# Patient Record
Sex: Female | Born: 1955 | Race: White | Hispanic: No | Marital: Married | Smoking: Former smoker
Health system: Southern US, Community
[De-identification: ages and names within clinical notes are randomized; demographics above are authoritative.]

## PROBLEM LIST (undated history)

## (undated) DIAGNOSIS — M1990 Unspecified osteoarthritis, unspecified site: Secondary | ICD-10-CM

## (undated) DIAGNOSIS — I10 Essential (primary) hypertension: Secondary | ICD-10-CM

## (undated) DIAGNOSIS — K219 Gastro-esophageal reflux disease without esophagitis: Secondary | ICD-10-CM

## (undated) HISTORY — PX: JOINT REPLACEMENT: SHX530

## (undated) HISTORY — PX: REPLACEMENT TOTAL HIP W/  RESURFACING IMPLANTS: SUR1222

## (undated) HISTORY — DX: Unspecified osteoarthritis, unspecified site: M19.90

## (undated) HISTORY — DX: Gastro-esophageal reflux disease without esophagitis: K21.9

---

## 2011-11-10 DIAGNOSIS — F32A Depression, unspecified: Secondary | ICD-10-CM | POA: Insufficient documentation

## 2018-08-21 DIAGNOSIS — Z87898 Personal history of other specified conditions: Secondary | ICD-10-CM | POA: Insufficient documentation

## 2018-10-08 DIAGNOSIS — E669 Obesity, unspecified: Secondary | ICD-10-CM | POA: Insufficient documentation

## 2018-10-08 DIAGNOSIS — E785 Hyperlipidemia, unspecified: Secondary | ICD-10-CM | POA: Insufficient documentation

## 2018-10-08 DIAGNOSIS — I10 Essential (primary) hypertension: Secondary | ICD-10-CM | POA: Insufficient documentation

## 2021-01-04 LAB — HM PAP SMEAR

## 2021-03-11 ENCOUNTER — Ambulatory Visit: Payer: Self-pay

## 2021-03-11 ENCOUNTER — Encounter: Payer: Self-pay | Admitting: Emergency Medicine

## 2021-03-11 ENCOUNTER — Other Ambulatory Visit: Payer: Self-pay

## 2021-03-11 ENCOUNTER — Ambulatory Visit
Admission: EM | Admit: 2021-03-11 | Discharge: 2021-03-11 | Disposition: A | Discharge status: Home / Self Care | Payer: BC Managed Care – PPO | Attending: Family Medicine | Admitting: Family Medicine

## 2021-03-11 ENCOUNTER — Ambulatory Visit (INDEPENDENT_AMBULATORY_CARE_PROVIDER_SITE_OTHER): Payer: BC Managed Care – PPO

## 2021-03-11 DIAGNOSIS — M79632 Pain in left forearm: Secondary | ICD-10-CM | POA: Diagnosis not present

## 2021-03-11 DIAGNOSIS — M25522 Pain in left elbow: Secondary | ICD-10-CM | POA: Diagnosis not present

## 2021-03-11 DIAGNOSIS — M79602 Pain in left arm: Secondary | ICD-10-CM | POA: Diagnosis not present

## 2021-03-11 HISTORY — DX: Essential (primary) hypertension: I10

## 2021-03-11 MED ORDER — MELOXICAM 7.5 MG PO TABS: 7.5000 mg | ORAL_TABLET | Freq: Every day | ORAL | 0 refills | Status: DC

## 2021-03-11 NOTE — Discharge Instructions (Addendum)
Medication as needed.  Xray was negative.  Take care  Dr. Adriana Simas
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
## 2021-03-15 ENCOUNTER — Other Ambulatory Visit: Payer: Self-pay | Admitting: Family Medicine

## 2021-12-09 DIAGNOSIS — H8110 Benign paroxysmal vertigo, unspecified ear: Secondary | ICD-10-CM | POA: Insufficient documentation

## 2022-05-10 ENCOUNTER — Ambulatory Visit: Payer: BC Managed Care – PPO | Admitting: Nurse Practitioner

## 2022-06-12 NOTE — Progress Notes (Unsigned)
   There were no vitals taken for this visit.   Subjective:    Patient ID: Victoria Roach, female    DOB: November 12, 1955, 66 y.o.   MRN: [id number]  HPI: Victoria Roach is a 66 y.o. female  No chief complaint on file.  Patient presents to clinic to establish care with new PCP.  Introduced to Designer, jewellery role and practice setting.  All questions answered.  Discussed provider/patient relationship and expectations.  Patient reports a history of ***. Patient denies a history of: Hypertension, Elevated Cholesterol, Diabetes, Thyroid problems, Depression, Anxiety, Neurological problems, and Abdominal problems.   Active Ambulatory Problems    Diagnosis Date Noted   BPPV (benign paroxysmal positional vertigo) 12/09/2021   Depression, unspecified 11/10/2011   History of prediabetes 08/21/2018   Hyperlipidemia 10/08/2018   Hypertension 10/08/2018   Obesity (BMI 30-39.9) 10/08/2018   Resolved Ambulatory Problems    Diagnosis Date Noted   No Resolved Ambulatory Problems   No Additional Past Medical History   Past Surgical History:  Procedure Laterality Date   REPLACEMENT TOTAL HIP W/  RESURFACING IMPLANTS Bilateral    No family history on file.   Review of Systems  Per HPI unless specifically indicated above     Objective:    There were no vitals taken for this visit.  Wt Readings from Last 3 Encounters:  03/11/21 182 lb (82.6 kg)    Physical Exam  No results found for this or any previous visit.    Assessment & Plan:   Problem List Items Addressed This Visit   None    Follow up plan: No follow-ups on file.

## 2022-06-13 ENCOUNTER — Ambulatory Visit (INDEPENDENT_AMBULATORY_CARE_PROVIDER_SITE_OTHER): Payer: Managed Care, Other (non HMO) | Admitting: Nurse Practitioner

## 2022-06-13 ENCOUNTER — Encounter: Payer: Self-pay | Admitting: Nurse Practitioner

## 2022-06-13 VITALS — BP 196/86 | HR 92 | Temp 97.8°F | Ht 64.96 in | Wt 193.4 lb

## 2022-06-13 DIAGNOSIS — I10 Essential (primary) hypertension: Secondary | ICD-10-CM

## 2022-06-13 DIAGNOSIS — E669 Obesity, unspecified: Secondary | ICD-10-CM

## 2022-06-13 DIAGNOSIS — Z1331 Encounter for screening for depression: Secondary | ICD-10-CM

## 2022-06-13 DIAGNOSIS — Z7689 Persons encountering health services in other specified circumstances: Secondary | ICD-10-CM

## 2022-06-13 DIAGNOSIS — Z23 Encounter for immunization: Secondary | ICD-10-CM | POA: Diagnosis not present

## 2022-06-13 MED ORDER — HYDROCHLOROTHIAZIDE 25 MG PO TABS: 25.0000 mg | ORAL_TABLET | Freq: Every day | ORAL | 1 refills | Status: DC

## 2022-06-13 MED ORDER — METFORMIN HCL 500 MG PO TABS: 500.0000 mg | ORAL_TABLET | Freq: Two times a day (BID) | ORAL | 3 refills | Status: DC

## 2022-06-13 MED ORDER — AMLODIPINE BESYLATE 10 MG PO TABS: 10.0000 mg | ORAL_TABLET | Freq: Every day | ORAL | 1 refills | Status: DC

## 2022-06-13 NOTE — Assessment & Plan Note (Signed)
Chronic. History of prediabetes.  Will try Metformin for weight loss.  Side effects and benefits of medications discussed during visit.  Follow up in 1 month for reevaluation.  Call sooner after concerns arise.

## 2022-06-13 NOTE — Assessment & Plan Note (Signed)
Chronic. Not well controlled.  Will increase Amlodipine to 10mg  daily.  Continue with HCTZ 25mg .  Keep track of blood pressure at home and bring log to next visit.  Follow up in 1 month.  Call sooner if concerns arise.

## 2022-06-19 ENCOUNTER — Encounter: Payer: Self-pay | Admitting: Nurse Practitioner

## 2022-08-02 ENCOUNTER — Other Ambulatory Visit: Payer: Self-pay | Admitting: Nurse Practitioner

## 2022-08-02 NOTE — Telephone Encounter (Signed)
Requested Prescriptions  Pending Prescriptions Disp Refills   metFORMIN (GLUCOPHAGE) 500 MG tablet [Pharmacy Med Name: METFORMIN HCL 500 MG TABLET] 360 tablet 1    Sig: TAKE 1 TABLET BY MOUTH IN THE MORNING AND 1 TABLET IN THE EVENING WITH MEALS AFTER 2 WEEKS INCREASE TO 2 TABS IN THE MORNING AND 2 TABS IN EVENING     Endocrinology:  Diabetes - Biguanides Failed - 08/02/2022  4:49 AM      Failed - Cr in normal range and within 360 days    No results found for: "CREATININE", "LABCREAU", "LABCREA", "POCCRE"       Failed - HBA1C is between 0 and 7.9 and within 180 days    No results found for: "HGBA1C", "LABA1C"       Failed - eGFR in normal range and within 360 days    No results found for: "GFRAA", "GFRNONAA", "GFR", "EGFR"       Failed - B12 Level in normal range and within 720 days    No results found for: "VITAMINB12"       Failed - CBC within normal limits and completed in the last 12 months    No results found for: "WBC", "WBCKUC" No results found for: "RBC", "RBCKUC" No results found for: "HGB", "HGBKUC", "HGBPOCKUC", "HGBOTHER", "TOTHGB", "HGBPLASMA", "LABHEMOF" No results found for: "HCT", "HCTKUC", "SRHCT" No results found for: "MCHC", "MCHCKUC" No results found for: "MCH", "[place]" No results found for: "MCVKUC", "MCV" No results found for: "PLTCOUNTKUC", "LABPLAT", "POCPLA" No results found for: "RDW", "[place]", "POCRDW"       Passed - Valid encounter within last 6 months    Recent Outpatient Visits           1 month ago Primary hypertension   [place] Jon Billings, NP       Future Appointments             In 5 days Jon Billings, NP [facility], [place]

## 2022-08-03 NOTE — Progress Notes (Signed)
BP 134/80 (BP Location: Left Arm, Cuff Size: Large)   Pulse 64   Temp 98.6 F (37 C) (Oral)   Ht 5\' 4"  (1.626 m)   Wt 189 lb 12.8 oz (86.1 kg)   SpO2 98%   BMI 32.58 kg/m    Subjective:    Patient ID: , female    DOB: 1955/12/02, 66 y.o.   MRN: [id number]  HPI: Victoria Roach is a 66 y.o. female presenting on 08/07/2022 for comprehensive medical examination. Current medical complaints include: Weight loss  She currently lives with: Menopausal Symptoms: no  HYPERTENSION / HYPERLIPIDEMIA Satisfied with current treatment? yes Duration of hypertension: years BP monitoring frequency: daily BP range: 120-160/70-80 BP medication side effects: no Past BP meds: amlodipine and HCTZ Duration of hyperlipidemia: years Cholesterol medication side effects: no Cholesterol supplements: none Past cholesterol medications: none Medication compliance: excellent compliance Aspirin: no Recent stressors: no Recurrent headaches: no Visual changes: no Palpitations: no Dyspnea: no Chest pain: no Lower extremity edema: no Dizzy/lightheaded: no   Depression Screen done today and results listed below:     08/07/2022    8:05 AM 06/13/2022    9:31 AM  Depression screen PHQ 2/9  Decreased Interest 1 0  Down, Depressed, Hopeless 0 1  PHQ - 2 Score 1 1  Altered sleeping 2 1  Tired, decreased energy 1 2  Change in appetite 1 1  Feeling bad or failure about yourself  0 0  Trouble concentrating 0 0  Moving slowly or fidgety/restless 0 0  Suicidal thoughts 0 0  PHQ-9 Score 5 5  Difficult doing work/chores Not difficult at all Not difficult at all    The patient does not have a history of falls. I did complete a risk assessment for falls. A plan of care for falls was documented.   Past Medical History:  Past Medical History:  Diagnosis Date   Hypertension     Surgical History:  Past Surgical History:  Procedure Laterality Date   REPLACEMENT TOTAL HIP W/   RESURFACING IMPLANTS Bilateral     Medications:  Current Outpatient Medications on File Prior to Visit  Medication Sig   amLODipine (NORVASC) 10 MG tablet Take 1 tablet (10 mg total) by mouth daily.   hydrochlorothiazide (HYDRODIURIL) 25 MG tablet Take 1 tablet (25 mg total) by mouth daily.   metFORMIN (GLUCOPHAGE) 500 MG tablet Take 1 tablet (500 mg total) by mouth 2 (two) times daily with a meal. Take 1 tab in the morning and 1 in the pm. After two weeks increase to 2 tabs in the morning and two in the evening   No current facility-administered medications on file prior to visit.    Allergies:  Allergies  Allergen Reactions   Codeine Nausea And Vomiting    Social History:  Social History   Socioeconomic History   Marital status: Married    Spouse name: Not on file   Number of children: Not on file   Years of education: Not on file   Highest education level: Not on file  Occupational History   Not on file  Tobacco Use   Smoking status: Former    Packs/day: 1.00    Types: Cigarettes    Quit date: 2008    Years since quitting: 15.8   Smokeless tobacco: Never  Vaping Use   Vaping Use: Never used  Substance and Sexual Activity   Alcohol use: Yes    Alcohol/week: 1.0 standard drink of alcohol  Types: 1 Glasses of wine per week   Drug use: Never   Sexual activity: Not Currently  Other Topics Concern   Not on file  Social History Narrative   Not on file   Social Determinants of Health   Financial Resource Strain: Not on file  Food Insecurity: Not on file  Transportation Needs: Not on file  Physical Activity: Not on file  Stress: Not on file  Social Connections: Not on file  Intimate Partner Violence: Not on file   Social History   Tobacco Use  Smoking Status Former   Packs/day: 1.00   Types: Cigarettes   Quit date: 2008   Years since quitting: 15.8  Smokeless Tobacco Never   Social History   Substance and Sexual Activity  Alcohol Use Yes    Alcohol/week: 1.0 standard drink of alcohol   Types: 1 Glasses of wine per week    Family History:  History reviewed. No pertinent family history.  Past medical history, surgical history, medications, allergies, family history and social history reviewed with patient today and changes made to appropriate areas of the chart.   Review of Systems  Eyes:  Negative for blurred vision and double vision.  Respiratory:  Negative for shortness of breath.   Cardiovascular:  Negative for chest pain, palpitations and leg swelling.  Neurological:  Negative for dizziness and headaches.   All other ROS negative except what is listed above and in the HPI.      Objective:    BP 134/80 (BP Location: Left Arm, Cuff Size: Large)   Pulse 64   Temp 98.6 F (37 C) (Oral)   Ht 5\' 4"  (1.626 m)   Wt 189 lb 12.8 oz (86.1 kg)   SpO2 98%   BMI 32.58 kg/m   Wt Readings from Last 3 Encounters:  08/07/22 189 lb 12.8 oz (86.1 kg)  06/13/22 193 lb 6.4 oz (87.7 kg)  03/11/21 182 lb (82.6 kg)    Physical Exam Vitals and nursing note reviewed.  Constitutional:      General: She is awake. She is not in acute distress.    Appearance: Normal appearance. She is well-developed. She is obese. She is not ill-appearing.  HENT:     Head: Normocephalic and atraumatic.     Right Ear: Hearing, tympanic membrane, ear canal and external ear normal. No drainage.     Left Ear: Hearing, tympanic membrane, ear canal and external ear normal. No drainage.     Nose: Nose normal.     Right Sinus: No maxillary sinus tenderness or frontal sinus tenderness.     Left Sinus: No maxillary sinus tenderness or frontal sinus tenderness.     Mouth/Throat:     Mouth: Mucous membranes are moist.     Pharynx: Oropharynx is clear. Uvula midline. No pharyngeal swelling, oropharyngeal exudate or posterior oropharyngeal erythema.  Eyes:     General: Lids are normal.        Right eye: No discharge.        Left eye: No discharge.      Extraocular Movements: Extraocular movements intact.     Conjunctiva/sclera: Conjunctivae normal.     Pupils: Pupils are equal, round, and reactive to light.     Visual Fields: Right eye visual fields normal and left eye visual fields normal.  Neck:     Thyroid: No thyromegaly.     Vascular: No carotid bruit.     Trachea: Trachea normal.  Cardiovascular:     Rate and  Rhythm: Normal rate and regular rhythm.     Heart sounds: Normal heart sounds. No murmur heard.    No gallop.  Pulmonary:     Effort: Pulmonary effort is normal. No accessory muscle usage or respiratory distress.     Breath sounds: Normal breath sounds.  Chest:  Breasts:    Right: Normal.     Left: Normal.  Abdominal:     General: Bowel sounds are normal.     Palpations: Abdomen is soft. There is no hepatomegaly or splenomegaly.     Tenderness: There is no abdominal tenderness.  Musculoskeletal:        General: Normal range of motion.     Cervical back: Normal range of motion and neck supple.     Right lower leg: No edema.     Left lower leg: No edema.  Lymphadenopathy:     Head:     Right side of head: No submental, submandibular, tonsillar, preauricular or posterior auricular adenopathy.     Left side of head: No submental, submandibular, tonsillar, preauricular or posterior auricular adenopathy.     Cervical: No cervical adenopathy.     Upper Body:     Right upper body: No supraclavicular, axillary or pectoral adenopathy.     Left upper body: No supraclavicular, axillary or pectoral adenopathy.  Skin:    General: Skin is warm and dry.     Capillary Refill: Capillary refill takes less than 2 seconds.     Findings: No rash.  Neurological:     Mental Status: She is alert and oriented to person, place, and time.     Gait: Gait is intact.  Psychiatric:        Attention and Perception: Attention normal.        Mood and Affect: Mood normal.        Speech: Speech normal.        Behavior: Behavior normal.  Behavior is cooperative.        Thought Content: Thought content normal.        Judgment: Judgment normal.     Results for orders placed or performed in visit on 06/19/22  HM PAP SMEAR  Result Value Ref Range   HM Pap smear see result scanned into chart       Assessment & Plan:   Problem List Items Addressed This Visit       Cardiovascular and Mediastinum   Hypertension    Chronic. Not well controlled.  Patient will be more diligent about checking at home in the evenings and bringing log to next visit.  Will adjust medications at that time.  Follow up in 1 month.  Call sooner if concerns arise.         Endocrine   History of prediabetes    Chronic.  Controlled.  Continue with current medication regimen of Metformin.  Labs ordered today.  Return to clinic in 6 months for reevaluation.  Call sooner if concerns arise.        Relevant Orders   HgB A1c     Other   Hyperlipidemia    Chronic.  Controlled.  Continue with current medication regimen.  Labs ordered today.  Return to clinic in 6 months for reevaluation.  Call sooner if concerns arise.        Relevant Orders   Lipid panel   Obesity (BMI 30-39.9)    BMI 32.5. Recommended eating smaller high protein, low fat meals more frequently and exercising 30 mins a day  5 times a week with a goal of 10-15lb weight loss in the next 3 months. Patient voiced their understanding and motivation to adhere to these recommendations.      Other Visit Diagnoses     Annual physical exam    -  Primary   Health maintenance reviewed during visit today.  Labs ordered.  Pneumonia shot given.  Mammogram and Cologuard ordered today.  Flu shot up to date. PAP done.   Relevant Orders   CBC with Differential/Platelet   Comprehensive metabolic panel   Lipid panel   TSH   Urinalysis, Routine w reflex microscopic   MM 3D SCREEN BREAST BILATERAL   HgB A1c   Screening for HIV (human immunodeficiency virus)       Relevant Orders   HIV Antibody  (routine testing w rflx)   Encounter for hepatitis C screening test for low risk patient       Relevant Orders   Hepatitis C Antibody   Post-menopausal       Relevant Orders   DG Bone Density   Screening for colon cancer       Relevant Orders   Cologuard   Encounter for screening mammogram for malignant neoplasm of breast       Relevant Orders   MM 3D SCREEN BREAST BILATERAL   Need for pneumococcal 20-valent conjugate vaccination       Relevant Orders   Pneumococcal conjugate vaccine 20-valent (Prevnar 20) (Completed)        Follow up plan: Return in about 1 month (around 09/06/2022) for BP Check, Weight Managment.   LABORATORY TESTING:  - Pap smear: up to date  IMMUNIZATIONS:   - Tdap: Tetanus vaccination status reviewed: last tetanus booster within 10 years. - Influenza: Up to date - Pneumovax: Up to date - Prevnar: Up to date - COVID: Not applicable - HPV: Not applicable - Shingrix vaccine:  Will get at next visit  SCREENING: -Mammogram: Ordered today  - Colonoscopy: Ordered today  - Bone Density: Ordered today  -Hearing Test: Not applicable  -Spirometry: Not applicable   PATIENT COUNSELING:   Advised to take 1 mg of folate supplement per day if capable of pregnancy.   Sexuality: Discussed sexually transmitted diseases, partner selection, use of condoms, avoidance of unintended pregnancy  and contraceptive alternatives.   Advised to avoid cigarette smoking.  I discussed with the patient that most people either abstain from alcohol or drink within safe limits (<=14/week and <=4 drinks/occasion for males, <=7/weeks and <= 3 drinks/occasion for females) and that the risk for alcohol disorders and other health effects rises proportionally with the number of drinks per week and how often a drinker exceeds daily limits.  Discussed cessation/primary prevention of drug use and availability of treatment for abuse.   Diet: Encouraged to adjust caloric intake to maintain   or achieve ideal body weight, to reduce intake of dietary saturated fat and total fat, to limit sodium intake by avoiding high sodium foods and not adding table salt, and to maintain adequate dietary potassium and calcium preferably from fresh fruits, vegetables, and low-fat dairy products.    stressed the importance of regular exercise  Injury prevention: Discussed safety belts, safety helmets, smoke detector, smoking near bedding or upholstery.   Dental health: Discussed importance of regular tooth brushing, flossing, and dental visits.    NEXT PREVENTATIVE PHYSICAL DUE IN 1 YEAR. Return in about 1 month (around 09/06/2022) for BP Check, Weight Managment.

## 2022-08-07 ENCOUNTER — Encounter: Payer: Self-pay | Admitting: Nurse Practitioner

## 2022-08-07 ENCOUNTER — Ambulatory Visit (INDEPENDENT_AMBULATORY_CARE_PROVIDER_SITE_OTHER): Payer: Managed Care, Other (non HMO) | Admitting: Nurse Practitioner

## 2022-08-07 VITALS — BP 134/80 | HR 64 | Temp 98.6°F | Ht 64.0 in | Wt 189.8 lb

## 2022-08-07 DIAGNOSIS — Z0000 Encounter for general adult medical examination without abnormal findings: Secondary | ICD-10-CM

## 2022-08-07 DIAGNOSIS — E782 Mixed hyperlipidemia: Secondary | ICD-10-CM

## 2022-08-07 DIAGNOSIS — Z1231 Encounter for screening mammogram for malignant neoplasm of breast: Secondary | ICD-10-CM

## 2022-08-07 DIAGNOSIS — Z23 Encounter for immunization: Secondary | ICD-10-CM | POA: Diagnosis not present

## 2022-08-07 DIAGNOSIS — Z114 Encounter for screening for human immunodeficiency virus [HIV]: Secondary | ICD-10-CM

## 2022-08-07 DIAGNOSIS — E669 Obesity, unspecified: Secondary | ICD-10-CM | POA: Diagnosis not present

## 2022-08-07 DIAGNOSIS — Z87898 Personal history of other specified conditions: Secondary | ICD-10-CM | POA: Diagnosis not present

## 2022-08-07 DIAGNOSIS — I10 Essential (primary) hypertension: Secondary | ICD-10-CM

## 2022-08-07 DIAGNOSIS — Z1211 Encounter for screening for malignant neoplasm of colon: Secondary | ICD-10-CM

## 2022-08-07 DIAGNOSIS — Z1159 Encounter for screening for other viral diseases: Secondary | ICD-10-CM

## 2022-08-07 DIAGNOSIS — Z780 Asymptomatic menopausal state: Secondary | ICD-10-CM

## 2022-08-07 LAB — URINALYSIS, ROUTINE W REFLEX MICROSCOPIC
Bilirubin, UA: NEGATIVE
Glucose, UA: NEGATIVE
Ketones, UA: NEGATIVE
Nitrite, UA: NEGATIVE
Protein,UA: NEGATIVE
Specific Gravity, UA: 1.02 (ref 1.005–1.030)
Urobilinogen, Ur: 0.2 mg/dL (ref 0.2–1.0)
pH, UA: 5 (ref 5.0–7.5)

## 2022-08-07 LAB — MICROSCOPIC EXAMINATION: Bacteria, UA: NONE SEEN

## 2022-08-07 NOTE — Assessment & Plan Note (Signed)
Chronic. Not well controlled.  Patient will be more diligent about checking at home in the evenings and bringing log to next visit.  Will adjust medications at that time.  Follow up in 1 month.  Call sooner if concerns arise.

## 2022-08-07 NOTE — Assessment & Plan Note (Signed)
BMI 32.5. Recommended eating smaller high protein, low fat meals more frequently and exercising 30 mins a day 5 times a week with a goal of 10-15lb weight loss in the next 3 months. Patient voiced their understanding and motivation to adhere to these recommendations.

## 2022-08-07 NOTE — Assessment & Plan Note (Signed)
Chronic.  Controlled.  Continue with current medication regimen of Metformin.  Labs ordered today.  Return to clinic in 6 months for reevaluation.  Call sooner if concerns arise.

## 2022-08-07 NOTE — Patient Instructions (Signed)
Please call to schedule your mammogram and/or bone density: [facility]  Address: [address], [place], [place] [address] Phone: [phone]  [facility] [place] [address] [place],  [place]  [address] Phone: [phone]   

## 2022-08-07 NOTE — Assessment & Plan Note (Signed)
Chronic.  Controlled.  Continue with current medication regimen.  Labs ordered today.  Return to clinic in 6 months for reevaluation.  Call sooner if concerns arise.  ? ?

## 2022-08-08 LAB — CBC WITH DIFFERENTIAL/PLATELET
Basophils Absolute: 0.1 10*3/uL (ref 0.0–0.2)
Basos: 1 %
EOS (ABSOLUTE): 0.3 10*3/uL (ref 0.0–0.4)
Eos: 4 %
Hematocrit: 42.7 % (ref 34.0–46.6)
Hemoglobin: 14.2 g/dL (ref 11.1–15.9)
Immature Grans (Abs): 0 10*3/uL (ref 0.0–0.1)
Immature Granulocytes: 0 %
Lymphocytes Absolute: 2 10*3/uL (ref 0.7–3.1)
Lymphs: 35 %
MCH: 29.5 pg (ref 26.6–33.0)
MCHC: 33.3 g/dL (ref 31.5–35.7)
MCV: 89 fL (ref 79–97)
Monocytes Absolute: 0.4 10*3/uL (ref 0.1–0.9)
Monocytes: 7 %
Neutrophils Absolute: 3.1 10*3/uL (ref 1.4–7.0)
Neutrophils: 53 %
Platelets: 199 10*3/uL (ref 150–450)
RBC: 4.81 x10E6/uL (ref 3.77–5.28)
RDW: 13.1 % (ref 11.7–15.4)
WBC: 5.9 10*3/uL (ref 3.4–10.8)

## 2022-08-08 LAB — COMPREHENSIVE METABOLIC PANEL
ALT: 17 IU/L (ref 0–32)
AST: 15 IU/L (ref 0–40)
Albumin/Globulin Ratio: 1.9 (ref 1.2–2.2)
Albumin: 4.6 g/dL (ref 3.9–4.9)
Alkaline Phosphatase: 78 IU/L (ref 44–121)
BUN/Creatinine Ratio: 21 (ref 12–28)
BUN: 14 mg/dL (ref 8–27)
Bilirubin Total: 0.3 mg/dL (ref 0.0–1.2)
CO2: 24 mmol/L (ref 20–29)
Calcium: 9.7 mg/dL (ref 8.7–10.3)
Chloride: 104 mmol/L (ref 96–106)
Creatinine, Ser: 0.68 mg/dL (ref 0.57–1.00)
Globulin, Total: 2.4 g/dL (ref 1.5–4.5)
Glucose: 108 mg/dL — ABNORMAL HIGH (ref 70–99)
Potassium: 3.6 mmol/L (ref 3.5–5.2)
Sodium: 144 mmol/L (ref 134–144)
Total Protein: 7 g/dL (ref 6.0–8.5)
eGFR: 97 mL/min/{1.73_m2} (ref >59)

## 2022-08-08 LAB — HEMOGLOBIN A1C
Est. average glucose Bld gHb Est-mCnc: 126 mg/dL
Hgb A1c MFr Bld: 6 % — ABNORMAL HIGH (ref 4.8–5.6)

## 2022-08-08 LAB — LIPID PANEL
Chol/HDL Ratio: 3 ratio (ref 0.0–4.4)
Cholesterol, Total: 211 mg/dL — ABNORMAL HIGH (ref 100–199)
HDL: 70 mg/dL (ref >39)
LDL Chol Calc (NIH): 119 mg/dL — ABNORMAL HIGH (ref 0–99)
Triglycerides: 125 mg/dL (ref 0–149)
VLDL Cholesterol Cal: 22 mg/dL (ref 5–40)

## 2022-08-08 LAB — TSH: TSH: 1.69 u[IU]/mL (ref 0.450–4.500)

## 2022-08-08 LAB — HEPATITIS C ANTIBODY: Hep C Virus Ab: NONREACTIVE

## 2022-08-08 LAB — HIV ANTIBODY (ROUTINE TESTING W REFLEX): HIV Screen 4th Generation wRfx: NONREACTIVE

## 2022-08-08 NOTE — Progress Notes (Signed)
Hi Victoria Roach. It was nice to see you yesterday.  Your lab work looks good.  Your A1c is well controlled in the prediabetic range at 6.0.  Cholesterol is elevated.  I recommend following a low fat diet and exercise.  No other concerns at this time. Continue with your current medication regimen.  Follow up as discussed.  Please let me know if you have any questions.

## 2022-09-04 NOTE — Progress Notes (Unsigned)
There were no vitals taken for this visit.   Subjective:    Patient ID: Victoria Roach, female    DOB: 01/26/1956, 66 y.o.   MRN: [id number]  HPI: Victoria Roach is a 66 y.o. female  No chief complaint on file.  HYPERTENSION / HYPERLIPIDEMIA Satisfied with current treatment? yes Duration of hypertension: years BP monitoring frequency: daily BP range: 120-160/70-80 BP medication side effects: no Past BP meds: amlodipine and HCTZ Duration of hyperlipidemia: years Cholesterol medication side effects: no Cholesterol supplements: none Past cholesterol medications: none Medication compliance: excellent compliance Aspirin: no Recent stressors: no Recurrent headaches: no Visual changes: no Palpitations: no Dyspnea: no Chest pain: no Lower extremity edema: no Dizzy/lightheaded: no   Relevant past medical, surgical, family and social history reviewed and updated as indicated. Interim medical history since our last visit reviewed. Allergies and medications reviewed and updated.  Review of Systems  Per HPI unless specifically indicated above     Objective:    There were no vitals taken for this visit.  Wt Readings from Last 3 Encounters:  08/07/22 189 lb 12.8 oz (86.1 kg)  06/13/22 193 lb 6.4 oz (87.7 kg)  03/11/21 182 lb (82.6 kg)    Physical Exam  Results for orders placed or performed in visit on 08/07/22  Microscopic Examination   Urine  Result Value Ref Range   WBC, UA 0-5 0 - 5 /hpf   RBC, Urine 0-2 0 - 2 /hpf   Epithelial Cells (non renal) 0-10 0 - 10 /hpf   Bacteria, UA None seen None seen/Few  CBC with Differential/Platelet  Result Value Ref Range   WBC 5.9 3.4 - 10.8 x10E3/uL   RBC 4.81 3.77 - 5.28 x10E6/uL   Hemoglobin 14.2 11.1 - 15.9 g/dL   Hematocrit 42.7 34.0 - 46.6 %   MCV 89 79 - 97 fL   MCH 29.5 26.6 - 33.0 pg   MCHC 33.3 31.5 - 35.7 g/dL   RDW 13.1 11.7 - 15.4 %   Platelets 199 150 - 450 x10E3/uL   Neutrophils 53 Not Estab. %    Lymphs 35 Not Estab. %   Monocytes 7 Not Estab. %   Eos 4 Not Estab. %   Basos 1 Not Estab. %   Neutrophils Absolute 3.1 1.4 - 7.0 x10E3/uL   Lymphocytes Absolute 2.0 0.7 - 3.1 x10E3/uL   Monocytes Absolute 0.4 0.1 - 0.9 x10E3/uL   EOS (ABSOLUTE) 0.3 0.0 - 0.4 x10E3/uL   Basophils Absolute 0.1 0.0 - 0.2 x10E3/uL   Immature Granulocytes 0 Not Estab. %   Immature Grans (Abs) 0.0 0.0 - 0.1 x10E3/uL  Comprehensive metabolic panel  Result Value Ref Range   Glucose 108 (H) 70 - 99 mg/dL   BUN 14 8 - 27 mg/dL   Creatinine, Ser 0.68 0.57 - 1.00 mg/dL   eGFR 97 >59 mL/min/1.73   BUN/Creatinine Ratio 21 12 - 28   Sodium 144 134 - 144 mmol/L   Potassium 3.6 3.5 - 5.2 mmol/L   Chloride 104 96 - 106 mmol/L   CO2 24 20 - 29 mmol/L   Calcium 9.7 8.7 - 10.3 mg/dL   Total Protein 7.0 6.0 - 8.5 g/dL   Albumin 4.6 3.9 - 4.9 g/dL   Globulin, Total 2.4 1.5 - 4.5 g/dL   Albumin/Globulin Ratio 1.9 1.2 - 2.2   Bilirubin Total 0.3 0.0 - 1.2 mg/dL   Alkaline Phosphatase 78 44 - 121 IU/L   AST 15 0 - 40 IU/L  ALT 17 0 - 32 IU/L  Lipid panel  Result Value Ref Range   Cholesterol, Total 211 (H) 100 - 199 mg/dL   Triglycerides 125 0 - 149 mg/dL   HDL 70 >39 mg/dL   VLDL Cholesterol Cal 22 5 - 40 mg/dL   LDL Chol Calc (NIH) 119 (H) 0 - 99 mg/dL   Chol/HDL Ratio 3.0 0.0 - 4.4 ratio  TSH  Result Value Ref Range   TSH 1.690 0.450 - 4.500 uIU/mL  Urinalysis, Routine w reflex microscopic  Result Value Ref Range   Specific Gravity, UA 1.020 1.005 - 1.030   pH, UA 5.0 5.0 - 7.5   Color, UA Yellow Yellow   Appearance Ur Clear Clear   Leukocytes,UA Trace (A) Negative   Protein,UA Negative Negative/Trace   Glucose, UA Negative Negative   Ketones, UA Negative Negative   RBC, UA Trace (A) Negative   Bilirubin, UA Negative Negative   Urobilinogen, Ur 0.2 0.2 - 1.0 mg/dL   Nitrite, UA Negative Negative   Microscopic Examination See below:   HgB A1c  Result Value Ref Range   Hgb A1c MFr Bld 6.0 (H) 4.8  - 5.6 %   Est. average glucose Bld gHb Est-mCnc 126 mg/dL  HIV Antibody (routine testing w rflx)  Result Value Ref Range   HIV Screen 4th Generation wRfx Non Reactive Non Reactive  Hepatitis C Antibody  Result Value Ref Range   Hep C Virus Ab Non Reactive Non Reactive      Assessment & Plan:   Problem List Items Addressed This Visit       Cardiovascular and Mediastinum   Hypertension - Primary     Endocrine   History of prediabetes     Other   Hyperlipidemia   Obesity (BMI 30-39.9)     Follow up plan: No follow-ups on file.

## 2022-09-05 ENCOUNTER — Ambulatory Visit (INDEPENDENT_AMBULATORY_CARE_PROVIDER_SITE_OTHER): Payer: Managed Care, Other (non HMO) | Admitting: Nurse Practitioner

## 2022-09-05 ENCOUNTER — Encounter: Payer: Self-pay | Admitting: Nurse Practitioner

## 2022-09-05 VITALS — BP 136/77 | HR 65 | Temp 98.4°F | Wt 195.7 lb

## 2022-09-05 DIAGNOSIS — I10 Essential (primary) hypertension: Secondary | ICD-10-CM | POA: Diagnosis not present

## 2022-09-05 DIAGNOSIS — E669 Obesity, unspecified: Secondary | ICD-10-CM

## 2022-09-05 DIAGNOSIS — E782 Mixed hyperlipidemia: Secondary | ICD-10-CM

## 2022-09-05 DIAGNOSIS — Z87898 Personal history of other specified conditions: Secondary | ICD-10-CM

## 2022-09-05 DIAGNOSIS — R7303 Prediabetes: Secondary | ICD-10-CM | POA: Diagnosis not present

## 2022-09-05 MED ORDER — ZEPBOUND 2.5 MG/0.5ML ~~LOC~~ SOAJ: 2.5000 mg | SUBCUTANEOUS | 0 refills | Status: DC

## 2022-09-05 NOTE — Assessment & Plan Note (Signed)
Chronic.  Controlled.  Continue with current medication regimen of Amlodipine and HCTZ.  Return to clinic in 6 months for reevaluation.  Call sooner if concerns arise.

## 2022-09-05 NOTE — Assessment & Plan Note (Signed)
Chronic.  Controlled without medication.  Last A1c was 6.0.  Return to clinic in 6 months for reevaluation.  Call sooner if concerns arise.

## 2022-09-05 NOTE — Patient Instructions (Signed)
Please call to schedule your mammogram and/or bone density: ?[facility]  ?Address: [address], , [place] [address] ?Phone: [phone]  ?

## 2022-09-05 NOTE — Assessment & Plan Note (Addendum)
Chronic. Will give Zepbound 2.5mg  weekly.  Side effects and benefits of medication discussed. Discussed proper diet and exercise are needed to be successful with weight loss.  Will titrate up as needed for continued weight loss.  Follow up in 1 month for reevaluation.

## 2022-09-06 ENCOUNTER — Telehealth: Payer: Self-pay

## 2022-09-06 NOTE — Telephone Encounter (Signed)
Called and scheduled Dexa and mammogram for 11/09/22 at 9:00 AM. LVM asking for patient to please return my call.

## 2022-09-06 NOTE — Telephone Encounter (Signed)
-----   Message from Larae Grooms, NP sent at 08/30/2022  2:38 PM EST ----- Can we schedule her mammogram?

## 2022-09-22 ENCOUNTER — Other Ambulatory Visit: Payer: Self-pay | Admitting: Nurse Practitioner

## 2022-09-22 MED ORDER — ZEPBOUND 2.5 MG/0.5ML ~~LOC~~ SOAJ: 2.5000 mg | SUBCUTANEOUS | 0 refills | Status: DC

## 2022-10-10 ENCOUNTER — Encounter: Payer: Self-pay | Admitting: Nurse Practitioner

## 2022-10-10 ENCOUNTER — Telehealth: Payer: Managed Care, Other (non HMO) | Admitting: Nurse Practitioner

## 2022-10-10 DIAGNOSIS — K5900 Constipation, unspecified: Secondary | ICD-10-CM

## 2022-10-10 DIAGNOSIS — E669 Obesity, unspecified: Secondary | ICD-10-CM

## 2022-10-10 MED ORDER — ZEPBOUND 5 MG/0.5ML ~~LOC~~ SOAJ: 5.0000 mg | SUBCUTANEOUS | 0 refills | Status: DC

## 2022-10-10 NOTE — Assessment & Plan Note (Signed)
Chronic. Ongoing.  Patient has lost about 9lbs since starting zepbound.  Doing well and denies side effects of nausea and upset stomach.  Patient is having some constipation.  Recommend increasing fiber intake as well as water. Will increased dose of Zepbound to 5mg  weekly.  Follow up in 1 month.  Call sooner if concerns arise.

## 2022-10-10 NOTE — Progress Notes (Signed)
There were no vitals taken for this visit.   Subjective:    Patient ID: Victoria Roach, female    DOB: Aug 18, 1956, 67 y.o.   MRN: [id number]  HPI: Victoria Roach is a 67 y.o. female  Chief Complaint  Patient presents with   Weight Check    1 month f/up- patient states she last weighed 10/03/22 and she weight 186 lbs   WEIGHT GAIN Was started on Zepbound about a month.  She feels like she is doing well with Mounjaro.  She is losing about 3lbs per week.  Current weight is 186lbs. Duration: years Previous attempts at weight loss: yes Complications of obesity: HTN Peak weight: 195 lb Weight loss goal: 145 lb Weight loss to date: 6 months Requesting obesity pharmacotherapy: yes Current weight loss supplements/medications: none Previous weight loss supplements/meds:  b12 injections Weight watchers, Nutrisystem, fasting, calorie counting    Relevant past medical, surgical, family and social history reviewed and updated as indicated. Interim medical history since our last visit reviewed. Allergies and medications reviewed and updated.  Review of Systems  Constitutional:  Positive for unexpected weight change.  Eyes:  Negative for visual disturbance.  Respiratory:  Negative for cough, chest tightness and shortness of breath.   Cardiovascular:  Negative for chest pain, palpitations and leg swelling.  Neurological:  Negative for dizziness and headaches.    Per HPI unless specifically indicated above     Objective:    There were no vitals taken for this visit.  Wt Readings from Last 3 Encounters:  09/05/22 195 lb 11.2 oz (88.8 kg)  08/07/22 189 lb 12.8 oz (86.1 kg)  06/13/22 193 lb 6.4 oz (87.7 kg)    Physical Exam Vitals and nursing note reviewed.  HENT:     Head: Normocephalic.     Right Ear: Hearing normal.     Left Ear: Hearing normal.     Nose: Nose normal.  Eyes:     Pupils: Pupils are equal, round, and reactive to light.  Pulmonary:     Effort: Pulmonary  effort is normal. No respiratory distress.  Neurological:     Mental Status: She is alert.  Psychiatric:        Mood and Affect: Mood normal.        Behavior: Behavior normal.        Thought Content: Thought content normal.        Judgment: Judgment normal.     Results for orders placed or performed in visit on 08/07/22  Microscopic Examination   Urine  Result Value Ref Range   WBC, UA 0-5 0 - 5 /hpf   RBC, Urine 0-2 0 - 2 /hpf   Epithelial Cells (non renal) 0-10 0 - 10 /hpf   Bacteria, UA None seen None seen/Few  CBC with Differential/Platelet  Result Value Ref Range   WBC 5.9 3.4 - 10.8 x10E3/uL   RBC 4.81 3.77 - 5.28 x10E6/uL   Hemoglobin 14.2 11.1 - 15.9 g/dL   Hematocrit 42.7 34.0 - 46.6 %   MCV 89 79 - 97 fL   MCH 29.5 26.6 - 33.0 pg   MCHC 33.3 31.5 - 35.7 g/dL   RDW 13.1 11.7 - 15.4 %   Platelets 199 150 - 450 x10E3/uL   Neutrophils 53 Not Estab. %   Lymphs 35 Not Estab. %   Monocytes 7 Not Estab. %   Eos 4 Not Estab. %   Basos 1 Not Estab. %   Neutrophils Absolute 3.1 1.4 -  7.0 x10E3/uL   Lymphocytes Absolute 2.0 0.7 - 3.1 x10E3/uL   Monocytes Absolute 0.4 0.1 - 0.9 x10E3/uL   EOS (ABSOLUTE) 0.3 0.0 - 0.4 x10E3/uL   Basophils Absolute 0.1 0.0 - 0.2 x10E3/uL   Immature Granulocytes 0 Not Estab. %   Immature Grans (Abs) 0.0 0.0 - 0.1 x10E3/uL  Comprehensive metabolic panel  Result Value Ref Range   Glucose 108 (H) 70 - 99 mg/dL   BUN 14 8 - 27 mg/dL   Creatinine, Ser 2.95 0.57 - 1.00 mg/dL   eGFR 97 >18 AC/ZYS/0.63   BUN/Creatinine Ratio 21 12 - 28   Sodium 144 134 - 144 mmol/L   Potassium 3.6 3.5 - 5.2 mmol/L   Chloride 104 96 - 106 mmol/L   CO2 24 20 - 29 mmol/L   Calcium 9.7 8.7 - 10.3 mg/dL   Total Protein 7.0 6.0 - 8.5 g/dL   Albumin 4.6 3.9 - 4.9 g/dL   Globulin, Total 2.4 1.5 - 4.5 g/dL   Albumin/Globulin Ratio 1.9 1.2 - 2.2   Bilirubin Total 0.3 0.0 - 1.2 mg/dL   Alkaline Phosphatase 78 44 - 121 IU/L   AST 15 0 - 40 IU/L   ALT 17 0 - 32 IU/L   Lipid panel  Result Value Ref Range   Cholesterol, Total 211 (H) 100 - 199 mg/dL   Triglycerides 016 0 - 149 mg/dL   HDL 70 >01 mg/dL   VLDL Cholesterol Cal 22 5 - 40 mg/dL   LDL Chol Calc (NIH) 093 (H) 0 - 99 mg/dL   Chol/HDL Ratio 3.0 0.0 - 4.4 ratio  TSH  Result Value Ref Range   TSH 1.690 0.450 - 4.500 uIU/mL  Urinalysis, Routine w reflex microscopic  Result Value Ref Range   Specific Gravity, UA 1.020 1.005 - 1.030   pH, UA 5.0 5.0 - 7.5   Color, UA Yellow Yellow   Appearance Ur Clear Clear   Leukocytes,UA Trace (A) Negative   Protein,UA Negative Negative/Trace   Glucose, UA Negative Negative   Ketones, UA Negative Negative   RBC, UA Trace (A) Negative   Bilirubin, UA Negative Negative   Urobilinogen, Ur 0.2 0.2 - 1.0 mg/dL   Nitrite, UA Negative Negative   Microscopic Examination See below:   HgB A1c  Result Value Ref Range   Hgb A1c MFr Bld 6.0 (H) 4.8 - 5.6 %   Est. average glucose Bld gHb Est-mCnc 126 mg/dL  HIV Antibody (routine testing w rflx)  Result Value Ref Range   HIV Screen 4th Generation wRfx Non Reactive Non Reactive  Hepatitis C Antibody  Result Value Ref Range   Hep C Virus Ab Non Reactive Non Reactive      Assessment & Plan:   Problem List Items Addressed This Visit       Other   Obesity (BMI 30-39.9) - Primary    Chronic. Ongoing.  Patient has lost about 9lbs since starting zepbound.  Doing well and denies side effects of nausea and upset stomach.  Patient is having some constipation.  Recommend increasing fiber intake as well as water. Will increased dose of Zepbound to 5mg  weekly.  Follow up in 1 month.  Call sooner if concerns arise.       Relevant Medications   tirzepatide (ZEPBOUND) 5 MG/0.5ML Pen     Follow up plan: Return in about 1 month (around 11/10/2022) for Weight Managment (virtual).  This visit was completed via MyChart due to the restrictions of the COVID-19  pandemic. All issues as above were discussed and addressed.  Physical exam was done as above through visual confirmation on MyChart. If it was felt that the patient should be evaluated in the office, they were directed there. The patient verbally consented to this visit. Location of the patient: Home Location of the provider: Office Those involved with this call:  Provider: Jon Billings, NP CMA: Yvonna Alanis, CMA Front Desk/Registration: Lynnell Catalan This encounter was conducted via video.  I spent 30 dedicated to the care of this patient on the date of this encounter to include previsit review of symptoms, medications, plan of care and follow up, face to face time with the patient, and post visit ordering of testing.

## 2022-10-18 ENCOUNTER — Ambulatory Visit
Admission: RE | Admit: 2022-10-18 | Discharge: 2022-10-18 | Disposition: A | Discharge status: Home / Self Care | Payer: Managed Care, Other (non HMO) | Source: Ambulatory Visit | Attending: Urgent Care | Admitting: Urgent Care

## 2022-10-18 VITALS — BP 128/89 | HR 90 | Temp 99.1°F | Resp 18

## 2022-10-18 DIAGNOSIS — J069 Acute upper respiratory infection, unspecified: Secondary | ICD-10-CM

## 2022-10-18 DIAGNOSIS — J029 Acute pharyngitis, unspecified: Secondary | ICD-10-CM | POA: Diagnosis not present

## 2022-10-18 LAB — POCT RAPID STREP A (OFFICE): Rapid Strep A Screen: NEGATIVE

## 2022-10-18 MED ORDER — LIDOCAINE VISCOUS HCL 2 % MT SOLN: 15.0000 mL | OROMUCOSAL | 0 refills | Status: AC | PRN

## 2022-10-18 NOTE — Discharge Instructions (Signed)
Follow up here or with your primary care provider if your symptoms are worsening or not improving.     

## 2022-10-18 NOTE — ED Provider Notes (Signed)
Victoria Roach    CSN: [id number] Arrival date & time: 10/18/22  1143      History   Chief Complaint Chief Complaint  Patient presents with   Sore Throat    Severe sore throat for three days.  Mild flu symptoms. - Entered by patient   Fatigue   Generalized Body Aches    HPI Victoria Roach is a 67 y.o. female.    Sore Throat    Presents to urgent care with complaint of acutely sore throat "like razor blades", fatigue, body aches x 3 days.  She states she has "mild flu symptoms".  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      Home Medications    Prior to Admission medications   Medication Sig Start Date End Date Taking? Authorizing Provider  amLODipine (NORVASC) 10 MG tablet Take 1 tablet (10 mg total) by mouth daily. 06/13/22   Jon Billings, NP  hydrochlorothiazide (HYDRODIURIL) 25 MG tablet Take 1 tablet (25 mg total) by mouth daily. 06/13/22   Jon Billings, NP  tirzepatide [facility]) 5 MG/0.5ML Pen Inject 5 mg into the skin once a week. 10/10/22   Jon Billings, NP    Family History History reviewed. No pertinent family history.  Social History Social History   Tobacco Use   Smoking status: Former    Packs/day: 1.00    Types: Cigarettes    Quit date: 2008    Years since quitting: 16.0   Smokeless tobacco: Never  Vaping Use   Vaping Use: Never used  Substance Use Topics   Alcohol use: Yes    Alcohol/week: 1.0 standard drink of alcohol    Types: 1 Glasses of wine per week   Drug use: Never     Allergies    Codeine   Review of Systems Review of Systems   Physical Exam Triage Vital Signs ED Triage Vitals  Enc Vitals Group     BP 10/18/22 1155 128/89     Pulse Rate 10/18/22 1155 90     Resp 10/18/22 1155 18     Temp 10/18/22 1155 99.1 F (37.3 C)     Temp Source 10/18/22 1155 Oral     SpO2 10/18/22 1155 94 %     Weight --      Height --      Head Circumference --      Peak Flow --      Pain Score 10/18/22 1159 0     Pain Loc --      Pain Edu? --      Excl. in [place]? --    No data found.  Updated Vital Signs BP 128/89 (BP Location: Left Arm)   Pulse 90   Temp 99.1 F (37.3 C) (Oral)   Resp 18   SpO2 94%   Visual Acuity Right Eye Distance:   Left Eye Distance:   Bilateral Distance:    Right Eye Near:   Left Eye Near:    Bilateral Near:     Physical Exam Vitals reviewed.  Constitutional:  Appearance: She is well-developed.  HENT:     Mouth/Throat:     Mouth: Mucous membranes are moist.     Pharynx: Posterior oropharyngeal erythema present. No oropharyngeal exudate.  Cardiovascular:     Rate and Rhythm: Normal rate and regular rhythm.     Heart sounds: Normal heart sounds.  Pulmonary:     Effort: Pulmonary effort is normal.     Breath sounds: Normal breath sounds.  Skin:    General: Skin is warm and dry.  Neurological:     General: No focal deficit present.     Mental Status: She is alert and oriented to person, place, and time.  Psychiatric:        Mood and Affect: Mood normal.        Behavior: Behavior normal.      UC Treatments / Results  Labs (all labs ordered are listed, but only abnormal results are displayed) Labs Reviewed  POCT RAPID STREP A (OFFICE)    EKG   Radiology No results found.  Procedures Procedures (including critical care time)  Medications Ordered in UC Medications - No data to display  Initial Impression / Assessment and Plan / UC Course  I have reviewed the triage vital signs and the nursing  notes.  Pertinent labs & imaging results that were available during my care of the patient were reviewed by me and considered in my medical decision making (see chart for details).   Patient is afebrile here without recent antipyretics. Satting well on room air. Overall is well appearing, well hydrated, without respiratory distress.  She has pharyngeal erythema, without peritonsillar exudates.  Rapid strep is negative.  Symptoms are consistent with an acute viral process including influenza.  She is outside the window for treatment with Tamiflu and will recommend use of OTC medication for symptom control.  Suggested ibuprofen, Chloraseptic for relief of her sore throat.  Also offered lidocaine and provided a treatment in clinic.   Final Clinical Impressions(s) / UC Diagnoses   Final diagnoses:  None   Discharge Instructions   None    ED Prescriptions   None    PDMP not reviewed this encounter.   Rose Phi, [place] 10/18/22 1223

## 2022-10-18 NOTE — ED Triage Notes (Signed)
Pt. Presents to UC w/ c/o a sore throat, fatigue and body aches for the past 3 days.

## 2022-11-09 ENCOUNTER — Other Ambulatory Visit: Payer: Managed Care, Other (non HMO)

## 2022-11-13 ENCOUNTER — Encounter: Payer: Self-pay | Admitting: Nurse Practitioner

## 2022-11-13 ENCOUNTER — Telehealth (INDEPENDENT_AMBULATORY_CARE_PROVIDER_SITE_OTHER): Payer: Managed Care, Other (non HMO) | Admitting: Nurse Practitioner

## 2022-11-13 VITALS — Wt 177.0 lb

## 2022-11-13 DIAGNOSIS — Z713 Dietary counseling and surveillance: Secondary | ICD-10-CM

## 2022-11-13 DIAGNOSIS — E669 Obesity, unspecified: Secondary | ICD-10-CM | POA: Diagnosis not present

## 2022-11-13 MED ORDER — ZEPBOUND 7.5 MG/0.5ML ~~LOC~~ SOAJ: 7.5000 mg | SUBCUTANEOUS | 0 refills | Status: DC

## 2022-11-13 NOTE — Progress Notes (Signed)
Wt 177 lb (80.3 kg)   BMI 30.38 kg/m    Subjective:    Patient ID: Victoria Roach, female    DOB: 03/15/1956, 67 y.o.   MRN: [id number]  HPI: Victoria Roach is a 67 y.o. female  Chief Complaint  Patient presents with   Medication Management   WEIGHT GAIN Was started on Zepbound about a month.  She feels like she is doing well with Mounjaro.  She doesn't have any side effects.  She has lost about 18lbs.  States she feels really good.   Duration: years Previous attempts at weight loss: yes Complications of obesity: HTN Peak weight: 195 lb Weight loss goal: 145 lb Weight loss to date: 6 months Requesting obesity pharmacotherapy: yes Current weight loss supplements/medications: none Previous weight loss supplements/meds:  b12 injections Weight watchers, Nutrisystem, fasting, calorie counting  Relevant past medical, surgical, family and social history reviewed and updated as indicated. Interim medical history since our last visit reviewed. Allergies and medications reviewed and updated.  Review of Systems  Constitutional:  Negative for unexpected weight change.    Per HPI unless specifically indicated above     Objective:    Wt 177 lb (80.3 kg)   BMI 30.38 kg/m   Wt Readings from Last 3 Encounters:  11/13/22 177 lb (80.3 kg)  09/05/22 195 lb 11.2 oz (88.8 kg)  08/07/22 189 lb 12.8 oz (86.1 kg)    Physical Exam Vitals and nursing note reviewed.  Constitutional:      General: She is not in acute distress.    Appearance: She is not ill-appearing.  HENT:     Head: Normocephalic.     Right Ear: Hearing normal.     Left Ear: Hearing normal.     Nose: Nose normal.  Pulmonary:     Effort: Pulmonary effort is normal. No respiratory distress.  Neurological:     Mental Status: She is alert.  Psychiatric:        Mood and Affect: Mood normal.        Behavior: Behavior normal.        Thought Content: Thought content normal.        Judgment: Judgment normal.      Results for orders placed or performed during the hospital encounter of 10/18/22  POCT rapid strep A  Result Value Ref Range   Rapid Strep A Screen Negative Negative      Assessment & Plan:   Problem List Items Addressed This Visit       Other   Obesity (BMI 30-39.9) - Primary    Chronic. Ongoing.  Patient has lost about 18lbs since starting zepbound.  Doing well and denies side effects of nausea and upset stomach.   Recommend increasing fiber intake as well as water. Will increased dose of Zepbound to 7.68m weekly.  Follow up in 2 months.  Will check CMP at next visit.  Call sooner if concerns arise.       Relevant Medications   tirzepatide (ZEPBOUND) 7.5 MG/0.5ML Pen     Follow up plan: Return in about 2 months (around 01/12/2023) for Weight Managment.  This visit was completed via MyChart due to the restrictions of the COVID-19 pandemic. All issues as above were discussed and addressed. Physical exam was done as above through visual confirmation on MyChart. If it was felt that the patient should be evaluated in the office, they were directed there. The patient verbally consented to this visit. Location of the patient: Home Location  of the provider: Office Those involved with this call:  Provider: Jon Billings, NP CMA: Yvonna Alanis, [place] Desk/Registration: Lynnell Catalan This encounter was conducted via video.  I spent 30 dedicated to the care of this patient on the date of this encounter to include previsit review of symptoms, plan of care and follow up, face to face time with the patient, and post visit ordering of testing.

## 2022-11-13 NOTE — Assessment & Plan Note (Signed)
Chronic. Ongoing.  Patient has lost about 18lbs since starting zepbound.  Doing well and denies side effects of nausea and upset stomach.   Recommend increasing fiber intake as well as water. Will increased dose of Zepbound to 7.99m weekly.  Follow up in 2 months.  Will check CMP at next visit.  Call sooner if concerns arise.

## 2022-11-13 NOTE — Patient Instructions (Signed)
Please call to schedule your mammogram and/or bone density: [facility]  Address: [address], [place], [place] [address] Phone: [phone]  [facility] [place] [address] [place],    [address] Phone: [phone]   

## 2022-11-14 NOTE — Progress Notes (Signed)
Appointment has been scheduled.

## 2022-12-01 ENCOUNTER — Other Ambulatory Visit: Payer: Self-pay | Admitting: Nurse Practitioner

## 2022-12-01 NOTE — Telephone Encounter (Signed)
90 day courtesy RF  Requested Prescriptions  Pending Prescriptions Disp Refills   amLODipine (NORVASC) 10 MG tablet [Pharmacy Med Name: AMLODIPINE BESYLATE 10 MG TAB] 90 tablet 0    Sig: TAKE 1 TABLET BY MOUTH EVERY DAY     Cardiovascular: Calcium Channel Blockers 2 Passed - 12/01/2022  9:14 AM      Passed - Last BP in normal range    BP Readings from Last 1 Encounters:  10/18/22 128/89         Passed - Last Heart Rate in normal range    Pulse Readings from Last 1 Encounters:  10/18/22 90         Passed - Valid encounter within last 6 months    Recent Outpatient Visits           2 weeks ago Obesity (BMI 30-39.9)   [place], NP   1 month ago Obesity (BMI 30-39.9)   [place], NP   2 months ago Primary hypertension   [place], NP   3 months ago Annual physical exam   [place], NP   5 months ago Primary hypertension   [place], NP       Future Appointments             In 1 month Jon Billings, NP [place], [facility]             hydrochlorothiazide (HYDRODIURIL) 25 MG tablet [Pharmacy Med Name: HYDROCHLOROTHIAZIDE 25 MG TAB] 90 tablet 0    Sig: TAKE 1 TABLET (25 MG TOTAL) BY MOUTH DAILY.     Cardiovascular: Diuretics - Thiazide Passed - 12/01/2022  9:14 AM      Passed - Cr in normal range and within 180 days    Creatinine, Ser  Date Value Ref Range Status  08/07/2022 0.68 0.57 - 1.00 mg/dL Final         Passed - K in normal range and within 180 days    Potassium  Date Value Ref Range Status  08/07/2022 3.6 3.5 - 5.2 mmol/L Final         Passed - Na in normal range and within 180 days    Sodium  Date Value Ref Range Status  08/07/2022 144 134 - 144 mmol/L Final         Passed - Last BP in normal range     BP Readings from Last 1 Encounters:  10/18/22 128/89         Passed - Valid encounter within last 6 months    Recent Outpatient Visits           2 weeks ago Obesity (BMI 30-39.9)   [place], NP   1 month ago Obesity (BMI 30-39.9)   [place], NP   2 months ago Primary hypertension   [place], NP   3 months ago Annual physical exam   [place], NP   5 months ago Primary hypertension   [place] Jon Billings, NP       Future Appointments             In 1 month Jon Billings, NP [place], [facility]
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
## 2022-12-10 ENCOUNTER — Encounter: Payer: Self-pay | Admitting: Nurse Practitioner

## 2022-12-12 ENCOUNTER — Other Ambulatory Visit: Payer: Self-pay | Admitting: Nurse Practitioner

## 2022-12-12 MED ORDER — ZEPBOUND 5 MG/0.5ML ~~LOC~~ SOAJ: 5.0000 mg | SUBCUTANEOUS | 1 refills | Status: DC

## 2022-12-13 NOTE — Telephone Encounter (Signed)
Requested Prescriptions  Pending Prescriptions Disp Refills   ZEPBOUND 5 MG/0.5ML Pen [Pharmacy Med Name: ZEPBOUND 5 MG/0.5 ML PEN]  1    Sig: INJECT 5 MG SUBCUTANEOUSLY WEEKLY     Off-Protocol Failed - 12/12/2022  9:04 AM      Failed - Medication not assigned to a protocol, review manually.      Passed - Valid encounter within last 12 months    Recent Outpatient Visits           1 month ago Obesity (BMI 30-39.9)   [place], NP   2 months ago Obesity (BMI 30-39.9)   [place], NP   3 months ago Primary hypertension   [place], Karen, NP   4 months ago Annual physical exam   [place], NP   6 months ago Primary hypertension   [place] Jon Billings, NP       Future Appointments             In 1 month Jon Billings, NP [place], [facility]

## 2023-01-15 ENCOUNTER — Other Ambulatory Visit: Payer: Self-pay | Admitting: Nurse Practitioner

## 2023-01-15 ENCOUNTER — Ambulatory Visit (INDEPENDENT_AMBULATORY_CARE_PROVIDER_SITE_OTHER): Payer: Managed Care, Other (non HMO) | Admitting: Nurse Practitioner

## 2023-01-15 ENCOUNTER — Encounter: Payer: Self-pay | Admitting: Nurse Practitioner

## 2023-01-15 VITALS — BP 138/80 | HR 56 | Temp 98.2°F | Wt 170.2 lb

## 2023-01-15 DIAGNOSIS — I10 Essential (primary) hypertension: Secondary | ICD-10-CM | POA: Diagnosis not present

## 2023-01-15 DIAGNOSIS — E669 Obesity, unspecified: Secondary | ICD-10-CM

## 2023-01-15 DIAGNOSIS — R7303 Prediabetes: Secondary | ICD-10-CM

## 2023-01-15 DIAGNOSIS — E782 Mixed hyperlipidemia: Secondary | ICD-10-CM | POA: Diagnosis not present

## 2023-01-15 MED ORDER — ZEPBOUND 10 MG/0.5ML ~~LOC~~ SOAJ: 10.0000 mg | SUBCUTANEOUS | 2 refills | Status: DC

## 2023-01-15 NOTE — Assessment & Plan Note (Signed)
Chronic.  Controlled.  Continue with current medication regimen of Amlodipine and HCTZ.  Return to clinic in 6 months for reevaluation.  Call sooner if concerns arise.   

## 2023-01-15 NOTE — Assessment & Plan Note (Signed)
Chronic. Improved.  Has lost 25lbs on Zepbound.  Will increase dose to .  Will continue with this dose for 3 months.  Labs ordered today.  Follow up in 3 months.  Call sooner if concerns arise.

## 2023-01-15 NOTE — Assessment & Plan Note (Signed)
Chronic.  Controlled.  Continue with current medication regimen.  Labs ordered today.  Return to clinic in 6 months for reevaluation.  Call sooner if concerns arise.  ? ?

## 2023-01-15 NOTE — Assessment & Plan Note (Signed)
Labs ordered at visit today.  Will make recommendations based on lab results.   

## 2023-01-15 NOTE — Progress Notes (Signed)
BP 138/80   Pulse (!) 56   Temp 98.2 F (36.8 C) (Oral)   Wt 170 lb 3.2 oz (77.2 kg)   SpO2 97%   BMI 29.21 kg/m    Subjective:    Patient ID: Victoria Roach, female    DOB: [birth year], 67 y.o.   MRN: [id number]  HPI: Victoria Roach is a 67 y.o. female  Chief Complaint  Patient presents with   Hypertension    Pt states she forgot to take her medication before today's visit    Obesity   HYPERTENSION / HYPERLIPIDEMIA Satisfied with current treatment? yes Duration of hypertension: years BP monitoring frequency: not checking BP range:  BP medication side effects: no Past BP meds: amlodipine and HCTZ Duration of hyperlipidemia: years Cholesterol medication side effects: no Cholesterol supplements: none Past cholesterol medications: none Medication compliance: excellent compliance Aspirin: no Recent stressors: no Recurrent headaches: no Visual changes: no Palpitations: no Dyspnea: no Chest pain: no Lower extremity edema: no Dizzy/lightheaded: no  WEIGHT MANAGEMENT Patient has lost about 25lbs since started Zepbound.  She is on the 7.5mg  dose.  She feels happy with her progress but would like to continue to lose.  She hasn't been exercising yet.  But plans to start soon to aid in her weight loss.    Relevant past medical, surgical, family and social history reviewed and updated as indicated. Interim medical history since our last visit reviewed. Allergies and medications reviewed and updated.  Review of Systems  Eyes:  Negative for visual disturbance.  Respiratory:  Negative for cough, chest tightness and shortness of breath.   Cardiovascular:  Negative for chest pain, palpitations and leg swelling.  Neurological:  Negative for dizziness and headaches.    Per HPI unless specifically indicated above     Objective:    BP 138/80   Pulse (!) 56   Temp 98.2 F (36.8 C) (Oral)   Wt 170 lb 3.2 oz (77.2 kg)   SpO2 97%   BMI 29.21 kg/m   Wt Readings from  Last 3 Encounters:  01/15/23 170 lb 3.2 oz (77.2 kg)  11/13/22 177 lb (80.3 kg)  09/05/22 195 lb 11.2 oz (88.8 kg)    Physical Exam Vitals and nursing note reviewed.  Constitutional:      General: She is not in acute distress.    Appearance: Normal appearance. She is normal weight. She is not ill-appearing, toxic-appearing or diaphoretic.  HENT:     Head: Normocephalic.     Right Ear: External ear normal.     Left Ear: External ear normal.     Nose: Nose normal.     Mouth/Throat:     Mouth: Mucous membranes are moist.     Pharynx: Oropharynx is clear.  Eyes:     General:        Right eye: No discharge.        Left eye: No discharge.     Extraocular Movements: Extraocular movements intact.     Conjunctiva/sclera: Conjunctivae normal.     Pupils: Pupils are equal, round, and reactive to light.  Cardiovascular:     Rate and Rhythm: Normal rate and regular rhythm.     Heart sounds: No murmur heard. Pulmonary:     Effort: Pulmonary effort is normal. No respiratory distress.     Breath sounds: Normal breath sounds. No wheezing or rales.  Musculoskeletal:     Cervical back: Normal range of motion and neck supple.  Skin:    General: Skin  is warm and dry.     Capillary Refill: Capillary refill takes less than 2 seconds.  Neurological:     General: No focal deficit present.     Mental Status: She is alert and oriented to person, place, and time. Mental status is at baseline.  Psychiatric:        Mood and Affect: Mood normal.        Behavior: Behavior normal.        Thought Content: Thought content normal.        Judgment: Judgment normal.     Results for orders placed or performed during the hospital encounter of 10/18/22  POCT rapid strep A  Result Value Ref Range   Rapid Strep A Screen Negative Negative      Assessment & Plan:   Problem List Items Addressed This Visit       Cardiovascular and Mediastinum   Hypertension    Chronic.  Controlled.  Continue with  current medication regimen of Amlodipine and HCTZ.  Return to clinic in 6 months for reevaluation.  Call sooner if concerns arise.        Relevant Orders   Comp Met (CMET)     Other   Hyperlipidemia    Chronic.  Controlled.  Continue with current medication regimen.  Labs ordered today.  Return to clinic in 6 months for reevaluation.  Call sooner if concerns arise.        Relevant Orders   Lipid Profile   Obesity (BMI 30-39.9) - Primary    Chronic. Improved.  Has lost 25lbs on Zepbound.  Will increase dose to .  Will continue with this dose for 3 months.  Labs ordered today.  Follow up in 3 months.  Call sooner if concerns arise.       Relevant Medications   tirzepatide (ZEPBOUND) 10 MG/0.5ML Pen   Other Relevant Orders   Comp Met (CMET)   Prediabetes    Labs ordered at visit today.  Will make recommendations based on lab results.        Relevant Orders   HgB A1c     Follow up plan: Return in about 3 months (around 04/16/2023) for Weight Managment (virtual).

## 2023-01-16 ENCOUNTER — Encounter: Payer: Self-pay | Admitting: Nurse Practitioner

## 2023-01-16 LAB — COMPREHENSIVE METABOLIC PANEL
ALT: 12 IU/L (ref 0–32)
AST: 14 IU/L (ref 0–40)
Albumin/Globulin Ratio: 1.7 (ref 1.2–2.2)
Albumin: 4.4 g/dL (ref 3.9–4.9)
Alkaline Phosphatase: 80 IU/L (ref 44–121)
BUN/Creatinine Ratio: 16 (ref 12–28)
BUN: 11 mg/dL (ref 8–27)
Bilirubin Total: 0.4 mg/dL (ref 0.0–1.2)
CO2: 21 mmol/L (ref 20–29)
Calcium: 9.7 mg/dL (ref 8.7–10.3)
Chloride: 103 mmol/L (ref 96–106)
Creatinine, Ser: 0.69 mg/dL (ref 0.57–1.00)
Globulin, Total: 2.6 g/dL (ref 1.5–4.5)
Glucose: 85 mg/dL (ref 70–99)
Potassium: 4 mmol/L (ref 3.5–5.2)
Sodium: 140 mmol/L (ref 134–144)
Total Protein: 7 g/dL (ref 6.0–8.5)
eGFR: 96 mL/min/{1.73_m2} (ref >59)

## 2023-01-16 LAB — LIPID PANEL
Chol/HDL Ratio: 3 ratio (ref 0.0–4.4)
Cholesterol, Total: 235 mg/dL — ABNORMAL HIGH (ref 100–199)
HDL: 78 mg/dL (ref >39)
LDL Chol Calc (NIH): 143 mg/dL — ABNORMAL HIGH (ref 0–99)
Triglycerides: 80 mg/dL (ref 0–149)
VLDL Cholesterol Cal: 14 mg/dL (ref 5–40)

## 2023-01-16 LAB — HEMOGLOBIN A1C
Est. average glucose Bld gHb Est-mCnc: 117 mg/dL
Hgb A1c MFr Bld: 5.7 % — ABNORMAL HIGH (ref 4.8–5.6)

## 2023-01-16 NOTE — Telephone Encounter (Signed)
Requested medication (s) are due for refill today - no  Requested medication (s) are on the active medication list -no  Future visit scheduled -no  Last refill: 01/15/23  Notes to clinic: Pharmacy request: alternative: PA required  Requested Prescriptions  Pending Prescriptions Disp Refills   ZEPBOUND 10 MG/0.5ML Pen [Pharmacy Med Name: ZEPBOUND 10 MG/0.5 ML PEN]  2    Sig: INJECT 10 MG INTO THE SKIN ONE TIME PER WEEK     Off-Protocol Failed - 01/15/2023 11:16 AM      Failed - Medication not assigned to a protocol, review manually.      Passed - Valid encounter within last 12 months    Recent Outpatient Visits           Yesterday Obesity (BMI 30-39.9)   [place] [facility] Larae Grooms, NP   2 months ago Obesity (BMI 30-39.9)   [place] [facility] [place] Larae Grooms, NP   3 months ago Obesity (BMI 30-39.9)   [place] [facility] Larae Grooms, NP   4 months ago Primary hypertension   [place] [facility] Larae Grooms, NP   5 months ago Annual physical exam   [place] [facility] Larae Grooms, NP                 Requested Prescriptions  Pending Prescriptions Disp Refills   ZEPBOUND 10 MG/0.5ML Pen [Pharmacy Med Name: ZEPBOUND 10 MG/0.5 ML PEN]  2    Sig: INJECT 10 MG INTO THE SKIN ONE TIME PER WEEK     Off-Protocol Failed - 01/15/2023 11:16 AM      Failed - Medication not assigned to a protocol, review manually.      Passed - Valid encounter within last 12 months    Recent Outpatient Visits           Yesterday Obesity (BMI 30-39.9)   [place] [facility] Larae Grooms, NP   2 months ago Obesity (BMI 30-39.9)   [place] [facility] Larae Grooms, NP   3 months ago Obesity (BMI 30-39.9)   [place] [facility] Larae Grooms, NP   4 months ago Primary hypertension   [place] [facility]  Larae Grooms, NP   5 months ago Annual physical exam   [place] [facility] Larae Grooms, NP

## 2023-01-16 NOTE — Progress Notes (Signed)
Please let patient know that her cholesterol is elevated.  Her cardiac risk score puts her at high risk of having a stroke or heart attack over the next 10 years.  I recommend that she start a statin called crestor  daily.  The goal will be to increase this to  daily if patient tolerates it well.  If she agrees to the medication I can send it to the pharmacy.    Otherwise, her A1c improved from 6.0% to 5.7%.  Your weight loss is paying off.  Keep up the good work.   The 10-year ASCVD risk score (Arnett DK, et al., 2019) is: 8.1%   Values used to calculate the score:     Age: 67 years     Sex: Female     Is Non-Hispanic African American: No     Diabetic: No     Tobacco smoker: No     Systolic Blood Pressure: 138 mmHg     Is BP treated: Yes     HDL Cholesterol: 78 mg/dL     Total Cholesterol: 235 mg/dL

## 2023-01-17 MED ORDER — ROSUVASTATIN CALCIUM 5 MG PO TABS: 5.0000 mg | ORAL_TABLET | Freq: Every day | ORAL | 1 refills | Status: DC

## 2023-01-19 ENCOUNTER — Encounter: Payer: Self-pay | Admitting: Nurse Practitioner

## 2023-01-22 ENCOUNTER — Other Ambulatory Visit: Payer: Self-pay

## 2023-01-22 MED ORDER — ZEPBOUND 10 MG/0.5ML ~~LOC~~ SOAJ
10.0000 mg | SUBCUTANEOUS | 2 refills | Status: DC
  Filled 2023-01-22 – 2023-01-23 (×2): qty 2, 28d supply, fill #0

## 2023-01-23 ENCOUNTER — Other Ambulatory Visit: Payer: Self-pay

## 2023-01-26 ENCOUNTER — Other Ambulatory Visit: Payer: Self-pay

## 2023-02-14 ENCOUNTER — Telehealth (INDEPENDENT_AMBULATORY_CARE_PROVIDER_SITE_OTHER): Payer: Managed Care, Other (non HMO) | Admitting: Nurse Practitioner

## 2023-02-14 ENCOUNTER — Encounter: Payer: Self-pay | Admitting: Nurse Practitioner

## 2023-02-14 DIAGNOSIS — E669 Obesity, unspecified: Secondary | ICD-10-CM

## 2023-02-14 MED ORDER — ZEPBOUND 10 MG/0.5ML ~~LOC~~ SOAJ
10.0000 mg | SUBCUTANEOUS | 2 refills | Status: DC
Start: 2023-02-14 — End: 2023-06-05

## 2023-02-14 NOTE — Assessment & Plan Note (Signed)
Chronic. Improved.  Has lost 7lbs on Zepbound.  Will Continue dose of Zepbound 10mg .  Will continue with this dose for 3 months.  Follow up in 3 months.  Call sooner if concerns arise.

## 2023-02-14 NOTE — Progress Notes (Signed)
There were no vitals taken for this visit.   Subjective:    Patient ID: Victoria Roach, female    DOB: 21-Aug-1956, 67 y.o.   MRN: [id number]  HPI: Victoria Roach is a 67 y.o. female  Chief Complaint  Patient presents with   Hypertension   Medication Management    Pt states since starting the Zebound everything has been working good for her    WEIGHT MANAGEMENT Patient has lost about 25lbs since started Zepbound.  She is on the 10mg  dose.  She feels happy with her progress but would like to continue to lose.  She has lost about 7lbs since starting the medication.  She hasn't been exercising yet.  She has noticed that it is has really decreased her appetite.    Relevant past medical, surgical, family and social history reviewed and updated as indicated. Interim medical history since our last visit reviewed. Allergies and medications reviewed and updated.  Review of Systems  Constitutional:  Negative for unexpected weight change.    Per HPI unless specifically indicated above     Objective:    There were no vitals taken for this visit.  Wt Readings from Last 3 Encounters:  01/15/23 170 lb 3.2 oz (77.2 kg)  11/13/22 177 lb (80.3 kg)  09/05/22 195 lb 11.2 oz (88.8 kg)    Physical Exam Vitals and nursing note reviewed.  HENT:     Head: Normocephalic.     Right Ear: Hearing normal.     Left Ear: Hearing normal.     Nose: Nose normal.  Eyes:     Pupils: Pupils are equal, round, and reactive to light.  Pulmonary:     Effort: Pulmonary effort is normal. No respiratory distress.  Neurological:     Mental Status: She is alert.  Psychiatric:        Mood and Affect: Mood normal.        Behavior: Behavior normal.        Thought Content: Thought content normal.        Judgment: Judgment normal.     Results for orders placed or performed in visit on 01/15/23  Comp Met (CMET)  Result Value Ref Range   Glucose 85 70 - 99 mg/dL   BUN 11 8 - 27 mg/dL   Creatinine,  Ser 1.19 0.57 - 1.00 mg/dL   eGFR 96 >14 NW/GNF/6.21   BUN/Creatinine Ratio 16 12 - 28   Sodium 140 134 - 144 mmol/L   Potassium 4.0 3.5 - 5.2 mmol/L   Chloride 103 96 - 106 mmol/L   CO2 21 20 - 29 mmol/L   Calcium 9.7 8.7 - 10.3 mg/dL   Total Protein 7.0 6.0 - 8.5 g/dL   Albumin 4.4 3.9 - 4.9 g/dL   Globulin, Total 2.6 1.5 - 4.5 g/dL   Albumin/Globulin Ratio 1.7 1.2 - 2.2   Bilirubin Total 0.4 0.0 - 1.2 mg/dL   Alkaline Phosphatase 80 44 - 121 IU/L   AST 14 0 - 40 IU/L   ALT 12 0 - 32 IU/L  Lipid Profile  Result Value Ref Range   Cholesterol, Total 235 (H) 100 - 199 mg/dL   Triglycerides 80 0 - 149 mg/dL   HDL 78 >30 mg/dL   VLDL Cholesterol Cal 14 5 - 40 mg/dL   LDL Chol Calc (NIH) 865 (H) 0 - 99 mg/dL   Chol/HDL Ratio 3.0 0.0 - 4.4 ratio  HgB A1c  Result Value Ref Range   Hgb  A1c MFr Bld 5.7 (H) 4.8 - 5.6 %   Est. average glucose Bld gHb Est-mCnc 117 mg/dL      Assessment & Plan:   Problem List Items Addressed This Visit       Other   Obesity (BMI 30-39.9) - Primary    Chronic. Improved.  Has lost 7lbs on Zepbound.  Will Continue dose of Zepbound 10mg .  Will continue with this dose for 3 months.  Follow up in 3 months.  Call sooner if concerns arise.       Relevant Medications   tirzepatide (ZEPBOUND) 10 MG/0.5ML Pen     Follow up plan: Return in about 3 months (around 05/17/2023) for Weight Managment.   This visit was completed via MyChart due to the restrictions of the COVID-19 pandemic. All issues as above were discussed and addressed. Physical exam was done as above through visual confirmation on MyChart. If it was felt that the patient should be evaluated in the office, they were directed there. The patient verbally consented to this visit. Location of the patient: Home Location of the provider: Office Those involved with this call:  Provider: Larae Grooms, NP CMA: Wilhemena Durie, CMA Front Desk/Registration: Servando Snare This encounter was  conducted via video.  I spent 20 dedicated to the care of this patient on the date of this encounter to include previsit review of symptoms, plan of care and follow up, face to face time with the patient, and post visit ordering of testing.

## 2023-02-28 ENCOUNTER — Other Ambulatory Visit: Payer: Self-pay | Admitting: Nurse Practitioner

## 2023-02-28 NOTE — Telephone Encounter (Signed)
Requested Prescriptions  Pending Prescriptions Disp Refills   hydrochlorothiazide (HYDRODIURIL) 25 MG tablet [Pharmacy Med Name: HYDROCHLOROTHIAZIDE 25 MG TAB] 90 tablet 0    Sig: TAKE 1 TABLET (25 MG TOTAL) BY MOUTH DAILY.     Cardiovascular: Diuretics - Thiazide Passed - 02/28/2023  2:30 AM      Passed - Cr in normal range and within 180 days    Creatinine, Ser  Date Value Ref Range Status  01/15/2023 0.69 0.57 - 1.00 mg/dL Final         Passed - K in normal range and within 180 days    Potassium  Date Value Ref Range Status  01/15/2023 4.0 3.5 - 5.2 mmol/L Final         Passed - Na in normal range and within 180 days    Sodium  Date Value Ref Range Status  01/15/2023 140 134 - 144 mmol/L Final         Passed - Last BP in normal range    BP Readings from Last 1 Encounters:  01/15/23 138/80         Passed - Valid encounter within last 6 months    Recent Outpatient Visits           2 weeks ago Obesity (BMI 30-39.9)   [place] [facility] Larae Grooms, NP   1 month ago Obesity (BMI 30-39.9)   [place] [facility] Larae Grooms, NP   3 months ago Obesity (BMI 30-39.9)   [place] [facility] Larae Grooms, NP   4 months ago Obesity (BMI 30-39.9)   [place] [facility] Larae Grooms, NP   5 months ago Primary hypertension   [place] [facility] [place], Clydie Braun, NP               amLODipine (NORVASC) 10 MG tablet [Pharmacy Med Name: AMLODIPINE BESYLATE 10 MG TAB] 90 tablet 0    Sig: TAKE 1 TABLET BY MOUTH EVERY DAY     Cardiovascular: Calcium Channel Blockers 2 Passed - 02/28/2023  2:30 AM      Passed - Last BP in normal range    BP Readings from Last 1 Encounters:  01/15/23 138/80         Passed - Last Heart Rate in normal range    Pulse Readings from Last 1 Encounters:  01/15/23 (!) 56         Passed - Valid encounter within last 6 months    Recent Outpatient  Visits           2 weeks ago Obesity (BMI 30-39.9)   [place] [facility] Larae Grooms, NP   1 month ago Obesity (BMI 30-39.9)   [place] [facility] Larae Grooms, NP   3 months ago Obesity (BMI 30-39.9)   [place] [facility] Larae Grooms, NP   4 months ago Obesity (BMI 30-39.9)   [place] [facility] Larae Grooms, NP   5 months ago Primary hypertension    [facility] Larae Grooms, NP

## 2023-03-20 ENCOUNTER — Other Ambulatory Visit: Payer: Self-pay | Admitting: Nurse Practitioner

## 2023-03-20 NOTE — Telephone Encounter (Signed)
Medication Refill - Medication: tirzepatide (ZEPBOUND) 10 MG/0.5ML Pen  Has the patient contacted their pharmacy? No.  Preferred Pharmacy (with phone number or street name): [facility] [phone] Judithann Sheen, [place] - [address]  Phone: [phone] Fax: [phone]  Has the patient been seen for an appointment in the last year OR does the patient have an upcoming appointment? No.  Agent: Please be advised that RX refills may take up to 3 business days. We ask that you follow-up with your pharmacy.

## 2023-03-20 NOTE — Telephone Encounter (Signed)
Rx 02/14/23 2ml 2RF- too soon Requested Prescriptions  Pending Prescriptions Disp Refills   tirzepatide (ZEPBOUND) 10 MG/0.5ML Pen 2 mL 2    Sig: Inject 10 mg into the skin once a week.     Off-Protocol Failed - 03/20/2023  9:17 AM      Failed - Medication not assigned to a protocol, review manually.      Passed - Valid encounter within last 12 months    Recent Outpatient Visits           1 month ago Obesity (BMI 30-39.9)   [place] [facility] Larae Grooms, NP   2 months ago Obesity (BMI 30-39.9)   [place] [facility] Larae Grooms, NP   4 months ago Obesity (BMI 30-39.9)   [place] [facility] Larae Grooms, NP   5 months ago Obesity (BMI 30-39.9)   [place] [facility] Larae Grooms, NP   6 months ago Primary hypertension   [facility] Larae Grooms, NP

## 2023-03-31 ENCOUNTER — Encounter: Payer: Self-pay | Admitting: Nurse Practitioner

## 2023-05-01 ENCOUNTER — Encounter: Payer: Self-pay | Admitting: Nurse Practitioner

## 2023-05-04 ENCOUNTER — Other Ambulatory Visit: Payer: Self-pay | Admitting: Physician Assistant

## 2023-05-04 NOTE — Progress Notes (Unsigned)
          Acute Office Visit   Patient: Francina Bethea   DOB: May 31, 1956   67 y.o. Female  MRN: [id number] Visit Date: 05/04/2023  Today's healthcare provider: Oswaldo Conroy Mehek Grega, PA-C  Introduced myself to the patient as a Secondary school teacher and provided education on APPs in clinical practice.    No chief complaint on file.  Subjective    HPI    Medications: Outpatient Medications Prior to Visit  Medication Sig   amLODipine (NORVASC) 10 MG tablet TAKE 1 TABLET BY MOUTH EVERY DAY   hydrochlorothiazide (HYDRODIURIL) 25 MG tablet TAKE 1 TABLET (25 MG TOTAL) BY MOUTH DAILY.   rosuvastatin (CRESTOR) 5 MG tablet Take 1 tablet (5 mg total) by mouth daily.   tirzepatide (ZEPBOUND) 10 MG/0.5ML Pen Inject 10 mg into the skin once a week.   No facility-administered medications prior to visit.    Review of Systems  {Insert previous labs (optional):23779} {See past labs  Heme  Chem  Endocrine  Serology  Results Review (optional):1}   Objective    There were no vitals taken for this visit. {Insert last BP/Wt (optional):23777}{See vitals history (optional):1}   Physical Exam    No results found for any visits on 05/04/23.  Assessment & Plan      No follow-ups on file.

## 2023-06-05 ENCOUNTER — Ambulatory Visit (INDEPENDENT_AMBULATORY_CARE_PROVIDER_SITE_OTHER): Payer: Managed Care, Other (non HMO) | Admitting: Family Medicine

## 2023-06-05 VITALS — BP 142/82 | HR 69 | Temp 98.0°F | Ht 64.17 in | Wt 155.6 lb

## 2023-06-05 DIAGNOSIS — E669 Obesity, unspecified: Secondary | ICD-10-CM

## 2023-06-05 DIAGNOSIS — L309 Dermatitis, unspecified: Secondary | ICD-10-CM | POA: Diagnosis not present

## 2023-06-05 MED ORDER — TIRZEPATIDE 12.5 MG/0.5ML ~~LOC~~ SOAJ: 12.5000 mg | SUBCUTANEOUS | 0 refills | Status: DC

## 2023-06-05 NOTE — Patient Instructions (Signed)
Try OTC cortisone cream on left ankle along with Zyrtec for a week for itchiness.

## 2023-06-05 NOTE — Progress Notes (Signed)
BP (!) 142/82   Pulse 69   Temp 98 F (36.7 C) (Oral)   Ht 5' 4.17" (1.63 m)   Wt 155 lb 9.6 oz (70.6 kg)   SpO2 97%   BMI 26.56 kg/m    Subjective:    Patient ID: Victoria Roach, female    DOB: 15-Nov-1955, 67 y.o.   MRN: [id number]  HPI: Victoria Roach is a 67 y.o. female  Chief Complaint  Patient presents with   Weight Loss    Follow up for Zepbound   She forgot to take her Amlodipine today, she is checking her BP at home, range at home is 140s/80s. Today's recheck is 142/82.   WEIGHT MANAGEMENT Her weight goal is 140-145 lbs.  Patient has lost about 40 lbs since started Zepbound.  She is on the 10mg  dose.  She feels happy with her progress but would like to continue to lose 10-15 more lbs. She has lost about 15 lbs since last visit. She hasn't been exercising yet but plans to start slow and do 10 minutes daily and work her way up. She has noticed no change in her appetite, but decreases her sugar cravings. She is eating x2 meals daily, consisting of vegetables, protein, calcium, and minimal fruit intake.   She complains of skin rash to right exterior ankle that has been there for 2 months now, she has tried Eczema cream with no improvement. She admits to itchiness. Denies pain, tingling, loss of sensation.   Relevant past medical, surgical, family and social history reviewed and updated as indicated. Interim medical history since our last visit reviewed. Allergies and medications reviewed and updated.  Review of Systems  Constitutional:  Negative for unexpected weight change.  Respiratory: Negative.    Cardiovascular: Negative.   Skin:  Positive for rash.       Left external ankle   Per HPI unless specifically indicated above     Objective:    BP (!) 142/82   Pulse 69   Temp 98 F (36.7 C) (Oral)   Ht 5' 4.17" (1.63 m)   Wt 155 lb 9.6 oz (70.6 kg)   SpO2 97%   BMI 26.56 kg/m   Wt Readings from Last 3 Encounters:  06/05/23 155 lb 9.6 oz (70.6 kg)   01/15/23 170 lb 3.2 oz (77.2 kg)  11/13/22 177 lb (80.3 kg)    Physical Exam Vitals and nursing note reviewed.  Constitutional:      General: She is awake. She is not in acute distress.    Appearance: Normal appearance. She is well-developed and well-groomed. She is not ill-appearing.  HENT:     Head: Normocephalic and atraumatic.     Right Ear: Hearing and external ear normal. No drainage.     Left Ear: Hearing and external ear normal. No drainage.     Nose: Nose normal.  Eyes:     General: Lids are normal.        Right eye: No discharge.        Left eye: No discharge.     Conjunctiva/sclera: Conjunctivae normal.     Pupils: Pupils are equal, round, and reactive to light.  Cardiovascular:     Rate and Rhythm: Normal rate and regular rhythm.     Pulses:          Radial pulses are 2+ on the right side and 2+ on the left side.       Posterior tibial pulses are 2+ on the right  side and 2+ on the left side.     Heart sounds: Normal heart sounds, S1 normal and S2 normal. No murmur heard.    No gallop.  Pulmonary:     Effort: Pulmonary effort is normal. No accessory muscle usage or respiratory distress.     Breath sounds: Normal breath sounds. No wheezing, rhonchi or rales.  Musculoskeletal:        General: Normal range of motion.     Cervical back: Full passive range of motion without pain and normal range of motion.     Right lower leg: No edema.     Left lower leg: No edema.  Skin:    General: Skin is warm and dry.     Capillary Refill: Capillary refill takes less than 2 seconds.     Findings: Rash present. Rash is scaling.          Comments: Right external ankle  Neurological:     Mental Status: She is alert and oriented to person, place, and time.  Psychiatric:        Attention and Perception: Attention normal.        Mood and Affect: Mood normal.        Speech: Speech normal.        Behavior: Behavior normal. Behavior is cooperative.        Thought Content: Thought  content normal.        Judgment: Judgment normal.     Results for orders placed or performed in visit on 01/15/23  Comp Met (CMET)  Result Value Ref Range   Glucose 85 70 - 99 mg/dL   BUN 11 8 - 27 mg/dL   Creatinine, Ser 9.52 0.57 - 1.00 mg/dL   eGFR 96 >84 XL/KGM/0.10   BUN/Creatinine Ratio 16 12 - 28   Sodium 140 134 - 144 mmol/L   Potassium 4.0 3.5 - 5.2 mmol/L   Chloride 103 96 - 106 mmol/L   CO2 21 20 - 29 mmol/L   Calcium 9.7 8.7 - 10.3 mg/dL   Total Protein 7.0 6.0 - 8.5 g/dL   Albumin 4.4 3.9 - 4.9 g/dL   Globulin, Total 2.6 1.5 - 4.5 g/dL   Albumin/Globulin Ratio 1.7 1.2 - 2.2   Bilirubin Total 0.4 0.0 - 1.2 mg/dL   Alkaline Phosphatase 80 44 - 121 IU/L   AST 14 0 - 40 IU/L   ALT 12 0 - 32 IU/L  Lipid Profile  Result Value Ref Range   Cholesterol, Total 235 (H) 100 - 199 mg/dL   Triglycerides 80 0 - 149 mg/dL   HDL 78 >27 mg/dL   VLDL Cholesterol Cal 14 5 - 40 mg/dL   LDL Chol Calc (NIH) 253 (H) 0 - 99 mg/dL   Chol/HDL Ratio 3.0 0.0 - 4.4 ratio  HgB A1c  Result Value Ref Range   Hgb A1c MFr Bld 5.7 (H) 4.8 - 5.6 %   Est. average glucose Bld gHb Est-mCnc 117 mg/dL      Assessment & Plan:   Problem List Items Addressed This Visit     Obesity (BMI 30-39.9)    Chronic. Improved.  Has lost 40lbs on Zepbound.  Will increase dose of Zepbound form 10 MG to 12.5 MG weekly.  Will continue with this dose for 2 months.  Follow up in 2 months.  Call sooner if concerns arise.       Relevant Medications   tirzepatide (MOUNJARO) 12.5 MG/0.5ML Pen   Other Visit Diagnoses  Dermatitis    -  Primary   Acute, stable. Right external ankle. Recommend otc hydrocortisone cream along with Zyrtec for itchiness, samples given in office.         Follow up plan: Return in about 2 months (around 08/05/2023) for physical and weight management.

## 2023-06-05 NOTE — Assessment & Plan Note (Addendum)
Chronic. Improved.  Has lost 40lbs on Zepbound.  Will increase dose of Zepbound form 10 MG to 12.5 MG weekly.  Will continue with this dose for 2 months.  Follow up in 2 months.  Call sooner if concerns arise.

## 2023-06-08 ENCOUNTER — Encounter: Payer: Self-pay | Admitting: Family Medicine

## 2023-06-08 ENCOUNTER — Telehealth: Payer: Self-pay

## 2023-06-08 NOTE — Telephone Encounter (Signed)
PA for Mounjaro 12.5 mg initiated and submitted via Cover My Meds. Key: [facility]

## 2023-06-11 ENCOUNTER — Other Ambulatory Visit: Payer: Self-pay

## 2023-06-11 MED ORDER — TIRZEPATIDE-WEIGHT MANAGEMENT 12.5 MG/0.5ML ~~LOC~~ SOAJ
12.5000 mg | SUBCUTANEOUS | 0 refills | Status: DC
  Filled 2023-06-11: qty 2, 28d supply, fill #0

## 2023-06-12 ENCOUNTER — Other Ambulatory Visit: Payer: Self-pay

## 2023-06-12 MED ORDER — TIRZEPATIDE-WEIGHT MANAGEMENT 12.5 MG/0.5ML ~~LOC~~ SOAJ: 12.5000 mg | SUBCUTANEOUS | 0 refills | Status: DC

## 2023-06-12 NOTE — Addendum Note (Signed)
Addended by: Prescott Gum on: 06/12/2023 08:50 AM   Modules accepted: Orders

## 2023-06-27 ENCOUNTER — Other Ambulatory Visit: Payer: Self-pay | Admitting: Nurse Practitioner

## 2023-06-27 NOTE — Telephone Encounter (Signed)
Requested medication (s) are due for refill today: No  Requested medication (s) are on the active medication list: yes    Last refill: 06/12/23  6ml  0 refills  Future visit scheduled yes 08/07/23  Notes to clinic:not delegated, please review. Thank you.  Requested Prescriptions  Pending Prescriptions Disp Refills   ZEPBOUND 10 MG/0.5ML Pen [Pharmacy Med Name: Zepbound 10 MG/0.5ML Subcutaneous Solution Auto-injector] 4 mL 0    Sig: INJECT 10MG  SUBCUTANEOUSLY ONCE WEEKLY     Off-Protocol Failed - 06/27/2023  6:53 AM      Failed - Medication not assigned to a protocol, review manually.      Passed - Valid encounter within last 12 months    Recent Outpatient Visits           3 weeks ago Dermatitis   [place] [facility] [place], Sherran Needs, NP   4 months ago Obesity (BMI 30-39.9)   [place] [facility] Larae Grooms, NP   5 months ago Obesity (BMI 30-39.9)   [place] [facility] Larae Grooms, NP   7 months ago Obesity (BMI 30-39.9)   [facility] Larae Grooms, NP   8 months ago Obesity (BMI 30-39.9)   [facility] Larae Grooms, NP       Future Appointments             In 1 month Larae Grooms, NP [facility]

## 2023-08-07 ENCOUNTER — Ambulatory Visit: Payer: Managed Care, Other (non HMO) | Admitting: Nurse Practitioner

## 2023-09-22 ENCOUNTER — Other Ambulatory Visit: Payer: Self-pay | Admitting: Family Medicine

## 2023-09-27 NOTE — Telephone Encounter (Signed)
 Requested medication (s) are due for refill today:   Yes  Requested medication (s) are on the active medication list:   Yes  Future visit scheduled:   Yes with Darice Petty, NP on 10/05/2023   Last ordered: 06/28/2023 4 ml, 0 refills   No protocol assigned   Requested Prescriptions  Pending Prescriptions Disp Refills   ZEPBOUND  12.5 MG/0.5ML Pen [Pharmacy Med Name: Zepbound  12.5 MG/0.5ML Subcutaneous Solution Auto-injector] 12 mL 0    Sig: INJECT 12.5MG  SUBCUTANEOUSLY ONCE A WEEK     Off-Protocol Failed - 09/27/2023 10:28 AM      Failed - Medication not assigned to a protocol, review manually.      Passed - Valid encounter within last 12 months    Recent Outpatient Visits           3 months ago Dermatitis   [place] [facility] [place], Hyla Givens, NP   7 months ago Obesity (BMI 30-39.9)   [place] [facility] Petty Darice, NP   8 months ago Obesity (BMI 30-39.9)   [place] [facility] Petty Darice, NP   10 months ago Obesity (BMI 30-39.9)   [place] [facility] Petty Darice, NP   11 months ago Obesity (BMI 30-39.9)    [facility] Petty Darice, NP       Future Appointments             In 1 week Petty Darice, NP [facility]

## 2023-10-05 ENCOUNTER — Ambulatory Visit (INDEPENDENT_AMBULATORY_CARE_PROVIDER_SITE_OTHER): Payer: Medicare HMO | Admitting: Nurse Practitioner

## 2023-10-05 ENCOUNTER — Telehealth: Payer: Self-pay

## 2023-10-05 ENCOUNTER — Encounter: Payer: Self-pay | Admitting: Nurse Practitioner

## 2023-10-05 VITALS — BP 135/80 | HR 57 | Temp 98.4°F | Ht 64.17 in | Wt 150.4 lb

## 2023-10-05 DIAGNOSIS — Z1231 Encounter for screening mammogram for malignant neoplasm of breast: Secondary | ICD-10-CM | POA: Diagnosis not present

## 2023-10-05 DIAGNOSIS — Z1211 Encounter for screening for malignant neoplasm of colon: Secondary | ICD-10-CM

## 2023-10-05 DIAGNOSIS — E782 Mixed hyperlipidemia: Secondary | ICD-10-CM

## 2023-10-05 DIAGNOSIS — R7303 Prediabetes: Secondary | ICD-10-CM | POA: Diagnosis not present

## 2023-10-05 DIAGNOSIS — I10 Essential (primary) hypertension: Secondary | ICD-10-CM | POA: Diagnosis not present

## 2023-10-05 DIAGNOSIS — Z0000 Encounter for general adult medical examination without abnormal findings: Secondary | ICD-10-CM | POA: Diagnosis not present

## 2023-10-05 DIAGNOSIS — E669 Obesity, unspecified: Secondary | ICD-10-CM

## 2023-10-05 MED ORDER — ZEPBOUND 12.5 MG/0.5ML ~~LOC~~ SOAJ: 12.5000 mg | SUBCUTANEOUS | 0 refills | Status: DC

## 2023-10-05 NOTE — Progress Notes (Deleted)
 There were no vitals taken for this visit.   Subjective:    Patient ID: Victoria Roach, female    DOB: 08-09-1956, 68 y.o.   MRN: [id number]  HPI: Victoria Roach is a 68 y.o. female presenting on 10/05/2023 for comprehensive medical examination. Current medical complaints include: Weight loss  She currently lives with: Menopausal Symptoms: no  HYPERTENSION / HYPERLIPIDEMIA Satisfied with current treatment? yes Duration of hypertension: years BP monitoring frequency: daily BP range: 120-160/70-80 BP medication side effects: no Past BP meds: amlodipine  and HCTZ Duration of hyperlipidemia: years Cholesterol medication side effects: no Cholesterol supplements: none Past cholesterol medications: none Medication compliance: excellent compliance Aspirin: no Recent stressors: no Recurrent headaches: no Visual changes: no Palpitations: no Dyspnea: no Chest pain: no Lower extremity edema: no Dizzy/lightheaded: no   Depression Screen done today and results listed below:     06/05/2023    2:50 PM 01/15/2023   10:54 AM 09/05/2022    9:58 AM 08/07/2022    8:05 AM 06/13/2022    9:31 AM  Depression screen PHQ 2/9  Decreased Interest 0 0 1 1 0  Down, Depressed, Hopeless 0 0 0 0 1  PHQ - 2 Score 0 0 1 1 1   Altered sleeping 3 2 2 2 1   Tired, decreased energy 1 0 1 1 2   Change in appetite 0 0 2 1 1   Feeling bad or failure about yourself  0 0 0 0 0  Trouble concentrating 0 0 0 0 0  Moving slowly or fidgety/restless 0 0 0 0 0  Suicidal thoughts 0 0 0 0 0  PHQ-9 Score 4 2 6 5 5   Difficult doing work/chores Not difficult at all Not difficult at all Not difficult at all Not difficult at all Not difficult at all    The patient does not have a history of falls. I did complete a risk assessment for falls. A plan of care for falls was documented.   Past Medical History:  Past Medical History:  Diagnosis Date  . Hypertension     Surgical History:  Past Surgical History:   Procedure Laterality Date  . 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 amLODipine  (NORVASC ) 10 MG tablet TAKE 1 TABLET BY MOUTH EVERY DAY  . hydrochlorothiazide  (HYDRODIURIL ) 25 MG tablet TAKE 1 TABLET (25 MG TOTAL) BY MOUTH DAILY.  . rosuvastatin  (CRESTOR ) 5 MG tablet Take 1 tablet (5 mg total) by mouth daily.  . ZEPBOUND  10 MG/0.5ML Pen INJECT 10MG  SUBCUTANEOUSLY ONCE WEEKLY  . ZEPBOUND  12.5 MG/0.5ML Pen INJECT 12.5MG  SUBCUTANEOUSLY ONCE A WEEK   No current facility-administered medications on file prior to visit.    Allergies:  Allergies  Allergen Reactions  . Codeine Nausea And Vomiting    Social History:  Social History   Socioeconomic History  . Marital status: Married    Spouse name: Not on file  . Number of children: Not on file  . Years of education: Not on file  . Highest education level: Bachelor's degree (e.g., BA, AB, BS)  Occupational History  . Not on file  Tobacco Use  . Smoking status: Former    Current packs/day: 0.00    Types: Cigarettes    Quit date: 2008    Years since quitting: 17.0  . Smokeless tobacco: Never  Vaping Use  . Vaping status: Never Used  Substance and Sexual Activity  .  Alcohol use: Yes    Alcohol/week: 1.0 standard drink of alcohol    Types: 1 Glasses of wine per week  . Drug use: Never  . Sexual activity: Not Currently  Other Topics Concern  . Not on file  Social History Narrative  . Not on file   Social Drivers of Health   Financial Resource Strain: Low Risk  (10/04/2023)   Overall Financial Resource Strain (CARDIA)   . Difficulty of Paying Living Expenses: Not hard at all  Food Insecurity: No Food Insecurity (10/04/2023)   Hunger Vital Sign   . Worried About Programme Researcher, Broadcasting/film/video in the Last Year: Never true   . Ran Out of Food in the Last Year: Never true  Transportation Needs: No Transportation Needs (10/04/2023)    PRAPARE - Transportation   . Lack of Transportation (Medical): No   . Lack of Transportation (Non-Medical): No  Physical Activity: Inactive (10/04/2023)   Exercise Vital Sign   . Days of Exercise per Week: 1 day   . Minutes of Exercise per Session: 0 min  Stress: No Stress Concern Present (10/04/2023)   [employer] of Occupational Health - Occupational Stress Questionnaire   . Feeling of Stress : Not at all  Social Connections: Moderately Isolated (10/04/2023)   Social Connection and Isolation Panel [NHANES]   . Frequency of Communication with Friends and Family: Once a week   . Frequency of Social Gatherings with Friends and Family: More than three times a week   . Attends Religious Services: Never   . Active Member of Clubs or Organizations: No   . Attends Banker Meetings: Not on file   . Marital Status: Married  Catering Manager Violence: Unknown (12/29/2021)   Received from [facility]   HITS   . Physically Hurt: Not on file   . Insult or Talk Down To: Not on file   . Threaten Physical Harm: Not on file   . Scream or Curse: Not on file   Social History   Tobacco Use  Smoking Status Former  . Current packs/day: 0.00  . Types: Cigarettes  . Quit date: 2008  . Years since quitting: 17.0  Smokeless Tobacco Never   Social History   Substance and Sexual Activity  Alcohol Use Yes  . Alcohol/week: 1.0 standard drink of alcohol  . Types: 1 Glasses of wine per week    Family History:  No family history on file.  Past medical history, surgical history, medications, allergies, family history and social history reviewed with patient today and changes made to appropriate areas of the chart.   Review of Systems  Eyes:  Negative for blurred vision and double vision.  Respiratory:  Negative for shortness of breath.   Cardiovascular:  Negative for chest pain, palpitations and leg swelling.  Neurological:  Negative for dizziness and headaches.    All other ROS negative except what is listed above and in the HPI.      Objective:    There were no vitals taken for this visit.  Wt Readings from Last 3 Encounters:  06/05/23 155 lb 9.6 oz (70.6 kg)  01/15/23 170 lb 3.2 oz (77.2 kg)  11/13/22 177 lb (80.3 kg)    Physical Exam Vitals and nursing note reviewed.  Constitutional:      General: She is awake. She is not in acute distress.    Appearance: Normal appearance. She is well-developed. She is obese. She is not ill-appearing.  HENT:  Head: Normocephalic and atraumatic.     Right Ear: Hearing, tympanic membrane, ear canal and external ear normal. No drainage.     Left Ear: Hearing, tympanic membrane, ear canal and external ear normal. No drainage.     Nose: Nose normal.     Right Sinus: No maxillary sinus tenderness or frontal sinus tenderness.     Left Sinus: No maxillary sinus tenderness or frontal sinus tenderness.     Mouth/Throat:     Mouth: Mucous membranes are moist.     Pharynx: Oropharynx is clear. Uvula midline. No pharyngeal swelling, oropharyngeal exudate or posterior oropharyngeal erythema.  Eyes:     General: Lids are normal.        Right eye: No discharge.        Left eye: No discharge.     Extraocular Movements: Extraocular movements intact.     Conjunctiva/sclera: Conjunctivae normal.     Pupils: Pupils are equal, round, and reactive to light.     Visual Fields: Right eye visual fields normal and left eye visual fields normal.  Neck:     Thyroid: No thyromegaly.     Vascular: No carotid bruit.     Trachea: Trachea normal.  Cardiovascular:     Rate and Rhythm: Normal rate and regular rhythm.     Heart sounds: Normal heart sounds. No murmur heard.    No gallop.  Pulmonary:     Effort: Pulmonary effort is normal. No accessory muscle usage or respiratory distress.     Breath sounds: Normal breath sounds.  Chest:  Breasts:    Right: Normal.     Left: Normal.  Abdominal:     General: Bowel sounds  are normal.     Palpations: Abdomen is soft. There is no hepatomegaly or splenomegaly.     Tenderness: There is no abdominal tenderness.  Musculoskeletal:        General: Normal range of motion.     Cervical back: Normal range of motion and neck supple.     Right lower leg: No edema.     Left lower leg: No edema.  Lymphadenopathy:     Head:     Right side of head: No submental, submandibular, tonsillar, preauricular or posterior auricular adenopathy.     Left side of head: No submental, submandibular, tonsillar, preauricular or posterior auricular adenopathy.     Cervical: No cervical adenopathy.     Upper Body:     Right upper body: No supraclavicular, axillary or pectoral adenopathy.     Left upper body: No supraclavicular, axillary or pectoral adenopathy.  Skin:    General: Skin is warm and dry.     Capillary Refill: Capillary refill takes less than 2 seconds.     Findings: No rash.  Neurological:     Mental Status: She is alert and oriented to person, place, and time.     Gait: Gait is intact.  Psychiatric:        Attention and Perception: Attention normal.        Mood and Affect: Mood normal.        Speech: Speech normal.        Behavior: Behavior normal. Behavior is cooperative.        Thought Content: Thought content normal.        Judgment: Judgment normal.    Results for orders placed or performed in visit on 01/15/23  Comp Met (CMET)   Collection Time: 01/15/23 11:11 AM  Result Value Ref Range  Glucose 85 70 - 99 mg/dL   BUN 11 8 - 27 mg/dL   Creatinine, Ser 9.30 0.57 - 1.00 mg/dL   eGFR 96 >40 fO/fpw/8.26   BUN/Creatinine Ratio 16 12 - 28   Sodium 140 134 - 144 mmol/L   Potassium 4.0 3.5 - 5.2 mmol/L   Chloride 103 96 - 106 mmol/L   CO2 21 20 - 29 mmol/L   Calcium  9.7 8.7 - 10.3 mg/dL   Total Protein 7.0 6.0 - 8.5 g/dL   Albumin 4.4 3.9 - 4.9 g/dL   Globulin, Total 2.6 1.5 - 4.5 g/dL   Albumin/Globulin Ratio 1.7 1.2 - 2.2   Bilirubin Total 0.4 0.0 - 1.2  mg/dL   Alkaline Phosphatase 80 44 - 121 IU/L   AST 14 0 - 40 IU/L   ALT 12 0 - 32 IU/L  Lipid Profile   Collection Time: 01/15/23 11:11 AM  Result Value Ref Range   Cholesterol, Total 235 (H) 100 - 199 mg/dL   Triglycerides 80 0 - 149 mg/dL   HDL 78 >60 mg/dL   VLDL Cholesterol Cal 14 5 - 40 mg/dL   LDL Chol Calc (NIH) 856 (H) 0 - 99 mg/dL   Chol/HDL Ratio 3.0 0.0 - 4.4 ratio  HgB A1c   Collection Time: 01/15/23 11:11 AM  Result Value Ref Range   Hgb A1c MFr Bld 5.7 (H) 4.8 - 5.6 %   Est. average glucose Bld gHb Est-mCnc 117 mg/dL      Assessment & Plan:   Problem List Items Addressed This Visit       Cardiovascular and Mediastinum   Hypertension     Other   Hyperlipidemia   Obesity (BMI 30-39.9)   Prediabetes   Other Visit Diagnoses       Annual physical exam    -  Primary        Follow up plan: No follow-ups on file.   LABORATORY TESTING:  - Pap smear: up to date  IMMUNIZATIONS:   - Tdap: Tetanus vaccination status reviewed: last tetanus booster within 10 years. - Influenza: Up to date - Pneumovax: Up to date - Prevnar: Up to date - COVID: Not applicable - HPV: Not applicable - Shingrix vaccine:  Will get at next visit  SCREENING: -Mammogram: Ordered today  - Colonoscopy: Ordered today  - Bone Density: Ordered today  -Hearing Test: Not applicable  -Spirometry: Not applicable   PATIENT COUNSELING:   Advised to take 1 mg of folate supplement per day if capable of pregnancy.   Sexuality: Discussed sexually transmitted diseases, partner selection, use of condoms, avoidance of unintended pregnancy  and contraceptive alternatives.   Advised to avoid cigarette smoking.  I discussed with the patient that most people either abstain from alcohol or drink within safe limits (<=14/week and <=4 drinks/occasion for males, <=7/weeks and <= 3 drinks/occasion for females) and that the risk for alcohol disorders and other health effects rises proportionally with  the number of drinks per week and how often a drinker exceeds daily limits.  Discussed cessation/primary prevention of drug use and availability of treatment for abuse.   Diet: Encouraged to adjust caloric intake to maintain  or achieve ideal body weight, to reduce intake of dietary saturated fat and total fat, to limit sodium intake by avoiding high sodium foods and not adding table salt, and to maintain adequate dietary potassium and calcium  preferably from fresh fruits, vegetables, and low-fat dairy products.    stressed the importance of regular  exercise  Injury prevention: Discussed safety belts, safety helmets, smoke detector, smoking near bedding or upholstery.   Dental health: Discussed importance of regular tooth brushing, flossing, and dental visits.    NEXT PREVENTATIVE PHYSICAL DUE IN 1 YEAR. No follow-ups on file.

## 2023-10-05 NOTE — Assessment & Plan Note (Signed)
Chronic.  Controlled.  Continue with current medication regimen of Amlodipine and HCTZ.  Return to clinic in 6 months for reevaluation.  Call sooner if concerns arise.   

## 2023-10-05 NOTE — Telephone Encounter (Signed)
-----   Message from Larae Grooms sent at 10/05/2023 10:35 AM EST ----- Can we schedule her mammogram

## 2023-10-05 NOTE — Progress Notes (Signed)
 BP 135/80 (BP Location: Right Arm, Patient Position: Sitting, Cuff Size: Normal)   Pulse (!) 57   Temp 98.4 F (36.9 C) (Oral)   Ht 5' 4.17 (1.63 m)   Wt 150 lb 6.4 oz (68.2 kg)   SpO2 98%   BMI 25.68 kg/m    Subjective:    Patient ID: Victoria Roach, female    DOB: 1955-12-18, 68 y.o.   MRN: [id number]  HPI: Victoria Roach is a 68 y.o. female  Chief Complaint  Patient presents with   Annual Exam   HYPERTENSION / HYPERLIPIDEMIA Satisfied with current treatment? yes Duration of hypertension: years BP monitoring frequency: daily BP range: 120/70-80 BP medication side effects: no Past BP meds: amlodipine  and HCTZ Duration of hyperlipidemia: years Cholesterol medication side effects: no Cholesterol supplements: none Past cholesterol medications: none Medication compliance: excellent compliance Aspirin: no Recent stressors: no Recurrent headaches: no Visual changes: no Palpitations: no Dyspnea: no Chest pain: no Lower extremity edema: no Dizzy/lightheaded: no     Relevant past medical, surgical, family and social history reviewed and updated as indicated. Interim medical history since our last visit reviewed. Allergies and medications reviewed and updated.  Review of Systems  Eyes:  Negative for visual disturbance.  Respiratory:  Negative for cough, chest tightness and shortness of breath.   Cardiovascular:  Negative for chest pain, palpitations and leg swelling.  Neurological:  Negative for dizziness and headaches.    Per HPI unless specifically indicated above     Objective:    BP 135/80 (BP Location: Right Arm, Patient Position: Sitting, Cuff Size: Normal)   Pulse (!) 57   Temp 98.4 F (36.9 C) (Oral)   Ht 5' 4.17 (1.63 m)   Wt 150 lb 6.4 oz (68.2 kg)   SpO2 98%   BMI 25.68 kg/m   Wt Readings from Last 3 Encounters:  10/05/23 150 lb 6.4 oz (68.2 kg)  06/05/23 155 lb 9.6 oz (70.6 kg)  01/15/23 170 lb 3.2 oz (77.2 kg)    Physical  Exam Vitals and nursing note reviewed.  Constitutional:      General: She is not in acute distress.    Appearance: Normal appearance. She is normal weight. She is not ill-appearing, toxic-appearing or diaphoretic.  HENT:     Head: Normocephalic.     Right Ear: External ear normal.     Left Ear: External ear normal.     Nose: Nose normal.     Mouth/Throat:     Mouth: Mucous membranes are moist.     Pharynx: Oropharynx is clear.  Eyes:     General:        Right eye: No discharge.        Left eye: No discharge.     Extraocular Movements: Extraocular movements intact.     Conjunctiva/sclera: Conjunctivae normal.     Pupils: Pupils are equal, round, and reactive to light.  Cardiovascular:     Rate and Rhythm: Normal rate and regular rhythm.     Heart sounds: No murmur heard. Pulmonary:     Effort: Pulmonary effort is normal. No respiratory distress.     Breath sounds: Normal breath sounds. No wheezing or rales.  Musculoskeletal:     Cervical back: Normal range of motion and neck supple.  Skin:    General: Skin is warm and dry.     Capillary Refill: Capillary refill takes less than 2 seconds.  Neurological:     General: No focal deficit present.  Mental Status: She is alert and oriented to person, place, and time. Mental status is at baseline.  Psychiatric:        Mood and Affect: Mood normal.        Behavior: Behavior normal.        Thought Content: Thought content normal.        Judgment: Judgment normal.     Results for orders placed or performed in visit on 01/15/23  Comp Met (CMET)   Collection Time: 01/15/23 11:11 AM  Result Value Ref Range   Glucose 85 70 - 99 mg/dL   BUN 11 8 - 27 mg/dL   Creatinine, Ser 9.30 0.57 - 1.00 mg/dL   eGFR 96 >40 fO/fpw/8.26   BUN/Creatinine Ratio 16 12 - 28   Sodium 140 134 - 144 mmol/L   Potassium 4.0 3.5 - 5.2 mmol/L   Chloride 103 96 - 106 mmol/L   CO2 21 20 - 29 mmol/L   Calcium  9.7 8.7 - 10.3 mg/dL   Total Protein 7.0 6.0  - 8.5 g/dL   Albumin 4.4 3.9 - 4.9 g/dL   Globulin, Total 2.6 1.5 - 4.5 g/dL   Albumin/Globulin Ratio 1.7 1.2 - 2.2   Bilirubin Total 0.4 0.0 - 1.2 mg/dL   Alkaline Phosphatase 80 44 - 121 IU/L   AST 14 0 - 40 IU/L   ALT 12 0 - 32 IU/L  Lipid Profile   Collection Time: 01/15/23 11:11 AM  Result Value Ref Range   Cholesterol, Total 235 (H) 100 - 199 mg/dL   Triglycerides 80 0 - 149 mg/dL   HDL 78 >60 mg/dL   VLDL Cholesterol Cal 14 5 - 40 mg/dL   LDL Chol Calc (NIH) 856 (H) 0 - 99 mg/dL   Chol/HDL Ratio 3.0 0.0 - 4.4 ratio  HgB A1c   Collection Time: 01/15/23 11:11 AM  Result Value Ref Range   Hgb A1c MFr Bld 5.7 (H) 4.8 - 5.6 %   Est. average glucose Bld gHb Est-mCnc 117 mg/dL      Assessment & Plan:   Problem List Items Addressed This Visit       Cardiovascular and Mediastinum   Hypertension   Chronic.  Controlled.  Continue with current medication regimen of Amlodipine  and HCTZ.  Return to clinic in 6 months for reevaluation.  Call sooner if concerns arise.         Other   Hyperlipidemia   Chronic.  Controlled.  Continue with current medication regimen.  Labs ordered today.  Return to clinic in 6 months for reevaluation.  Call sooner if concerns arise.       Relevant Orders   Lipid panel   Prediabetes   Labs ordered at visit today.  Will make recommendations based on lab results.        Relevant Orders   HgB A1c   RESOLVED: Obesity (BMI 30-39.9)   Relevant Medications   tirzepatide  (ZEPBOUND ) 12.5 MG/0.5ML Pen   Other Visit Diagnoses       Annual physical exam    -  Primary   Relevant Orders   Comprehensive metabolic panel   Lipid panel     Encounter for screening mammogram for malignant neoplasm of breast       Relevant Orders   MM 3D SCREENING MAMMOGRAM BILATERAL BREAST     Screening for colon cancer       Relevant Orders   Cologuard        Follow up plan: Return  in about 3 months (around 01/03/2024) for Welcome to Medicare.

## 2023-10-05 NOTE — Telephone Encounter (Signed)
 Patient is scheduled for Mammogram on 10/22/23 at 12:20 PM. Patient was notified via MyChart.

## 2023-10-05 NOTE — Assessment & Plan Note (Signed)
 Labs ordered at visit today.  Will make recommendations based on lab results.

## 2023-10-05 NOTE — Assessment & Plan Note (Signed)
 Chronic.  Controlled.  Continue with current medication regimen.  Labs ordered today.  Return to clinic in 6 months for reevaluation.  Call sooner if concerns arise.  ? ?

## 2023-10-06 LAB — LIPID PANEL
Chol/HDL Ratio: 3 {ratio} (ref 0.0–4.4)
Cholesterol, Total: 241 mg/dL — ABNORMAL HIGH (ref 100–199)
HDL: 81 mg/dL (ref >39)
LDL Chol Calc (NIH): 135 mg/dL — ABNORMAL HIGH (ref 0–99)
Triglycerides: 143 mg/dL (ref 0–149)
VLDL Cholesterol Cal: 25 mg/dL (ref 5–40)

## 2023-10-06 LAB — HEMOGLOBIN A1C
Est. average glucose Bld gHb Est-mCnc: 108 mg/dL
Hgb A1c MFr Bld: 5.4 % (ref 4.8–5.6)

## 2023-10-06 LAB — COMPREHENSIVE METABOLIC PANEL
ALT: 22 [IU]/L (ref 0–32)
AST: 19 [IU]/L (ref 0–40)
Albumin: 4.4 g/dL (ref 3.9–4.9)
Alkaline Phosphatase: 81 [IU]/L (ref 44–121)
BUN/Creatinine Ratio: 11 — ABNORMAL LOW (ref 12–28)
BUN: 8 mg/dL (ref 8–27)
Bilirubin Total: 0.6 mg/dL (ref 0.0–1.2)
CO2: 26 mmol/L (ref 20–29)
Calcium: 9.6 mg/dL (ref 8.7–10.3)
Chloride: 99 mmol/L (ref 96–106)
Creatinine, Ser: 0.7 mg/dL (ref 0.57–1.00)
Globulin, Total: 2.7 g/dL (ref 1.5–4.5)
Glucose: 92 mg/dL (ref 70–99)
Potassium: 3.7 mmol/L (ref 3.5–5.2)
Sodium: 141 mmol/L (ref 134–144)
Total Protein: 7.1 g/dL (ref 6.0–8.5)
eGFR: 95 mL/min/{1.73_m2} (ref >59)

## 2023-10-08 ENCOUNTER — Telehealth: Payer: Self-pay

## 2023-10-08 NOTE — Telephone Encounter (Signed)
 PA for Zepbound initiated and submitted via Cover My Meds. Key: [id number]

## 2023-10-10 NOTE — Telephone Encounter (Signed)
 PA has been denied.

## 2023-10-22 ENCOUNTER — Ambulatory Visit
Admission: RE | Admit: 2023-10-22 | Discharge: 2023-10-22 | Disposition: A | Discharge status: Home / Self Care | Payer: Medicare HMO | Source: Ambulatory Visit | Attending: Nurse Practitioner | Admitting: Nurse Practitioner

## 2023-10-22 DIAGNOSIS — Z1231 Encounter for screening mammogram for malignant neoplasm of breast: Secondary | ICD-10-CM | POA: Insufficient documentation

## 2023-11-05 ENCOUNTER — Other Ambulatory Visit: Payer: Self-pay | Admitting: Nurse Practitioner

## 2023-11-05 DIAGNOSIS — R928 Other abnormal and inconclusive findings on diagnostic imaging of breast: Secondary | ICD-10-CM

## 2023-11-08 ENCOUNTER — Ambulatory Visit
Admission: RE | Admit: 2023-11-08 | Discharge: 2023-11-08 | Discharge status: Home / Self Care | Payer: Medicare HMO | Source: Ambulatory Visit | Attending: Nurse Practitioner

## 2023-11-08 ENCOUNTER — Ambulatory Visit
Admission: RE | Admit: 2023-11-08 | Discharge: 2023-11-08 | Disposition: A | Discharge status: Home / Self Care | Payer: Medicare HMO | Source: Ambulatory Visit | Attending: Nurse Practitioner | Admitting: Nurse Practitioner

## 2023-11-08 DIAGNOSIS — R92322 Mammographic fibroglandular density, left breast: Secondary | ICD-10-CM | POA: Diagnosis not present

## 2023-11-08 DIAGNOSIS — R928 Other abnormal and inconclusive findings on diagnostic imaging of breast: Secondary | ICD-10-CM | POA: Insufficient documentation

## 2023-11-20 ENCOUNTER — Inpatient Hospital Stay
Admission: RE | Admit: 2023-11-20 | Discharge: 2023-11-20 | Disposition: A | Discharge status: Home / Self Care | Payer: Self-pay | Source: Ambulatory Visit | Attending: Nurse Practitioner | Admitting: Nurse Practitioner

## 2023-11-20 ENCOUNTER — Other Ambulatory Visit: Payer: Self-pay | Admitting: *Deleted

## 2023-11-20 ENCOUNTER — Inpatient Hospital Stay
Admission: RE | Admit: 2023-11-20 | Discharge: 2023-11-20 | Disposition: A | Discharge status: Home / Self Care | Payer: Self-pay | Source: Ambulatory Visit | Attending: Nurse Practitioner

## 2023-11-20 DIAGNOSIS — Z1231 Encounter for screening mammogram for malignant neoplasm of breast: Secondary | ICD-10-CM

## 2024-01-03 ENCOUNTER — Encounter: Payer: Self-pay | Admitting: Nurse Practitioner

## 2024-01-03 ENCOUNTER — Ambulatory Visit: Payer: Medicare HMO | Admitting: Nurse Practitioner

## 2024-01-03 VITALS — BP 112/71 | HR 56 | Temp 98.7°F | Resp 16 | Ht 64.17 in | Wt 152.2 lb

## 2024-01-03 DIAGNOSIS — Z7189 Other specified counseling: Secondary | ICD-10-CM | POA: Diagnosis not present

## 2024-01-03 DIAGNOSIS — Z0000 Encounter for general adult medical examination without abnormal findings: Secondary | ICD-10-CM | POA: Diagnosis not present

## 2024-01-03 DIAGNOSIS — Z780 Asymptomatic menopausal state: Secondary | ICD-10-CM | POA: Diagnosis not present

## 2024-01-03 DIAGNOSIS — I10 Essential (primary) hypertension: Secondary | ICD-10-CM | POA: Diagnosis not present

## 2024-01-03 DIAGNOSIS — F17210 Nicotine dependence, cigarettes, uncomplicated: Secondary | ICD-10-CM

## 2024-01-03 MED ORDER — AMLODIPINE BESYLATE 10 MG PO TABS: 10.0000 mg | ORAL_TABLET | Freq: Every day | ORAL | 0 refills | Status: DC

## 2024-01-03 MED ORDER — HYDROCHLOROTHIAZIDE 25 MG PO TABS: 25.0000 mg | ORAL_TABLET | Freq: Every day | ORAL | 1 refills | Status: AC

## 2024-01-03 MED ORDER — HYDROCHLOROTHIAZIDE 25 MG PO TABS
25.0000 mg | ORAL_TABLET | Freq: Every day | ORAL | 0 refills | Status: DC
Start: 2024-01-03 — End: 2024-01-03

## 2024-01-03 MED ORDER — AMLODIPINE BESYLATE 10 MG PO TABS: 10.0000 mg | ORAL_TABLET | Freq: Every day | ORAL | 1 refills | Status: AC

## 2024-01-03 MED ORDER — ZEPBOUND 12.5 MG/0.5ML ~~LOC~~ SOAJ: 12.5000 mg | SUBCUTANEOUS | 0 refills | Status: DC

## 2024-01-03 NOTE — Progress Notes (Deleted)
 Subjective:   Victoria Roach is a 68 y.o. female who presents for Medicare Annual (Subsequent) preventive examination.  Visit Complete: {[phone]}  Patient Medicare AWV questionnaire was completed by the patient on ***; I have confirmed that all information answered by patient is correct and no changes since this date.  Cardiac Risk Factors include: advanced age (>75men, >52 women)     Objective:    Today's Vitals   01/03/24 1057  BP: (!) 172/80  Pulse: (!) 56  Resp: 16  Temp: 98.7 F (37.1 C)  TempSrc: Oral  SpO2: 98%  Weight: 152 lb 3.2 oz (69 kg)  Height: 5' 4.17" (1.63 m)  PainSc: 0-No pain   Body mass index is 25.98 kg/m.     01/03/2024   11:01 AM 03/11/2021    4:07 PM  Advanced Directives  Does Patient Have a Medical Advance Directive? No No  Would patient like information on creating a medical advance directive? Yes (MAU/Ambulatory/Procedural Areas - Information given)     Current Medications (verified) Outpatient Encounter Medications as of 01/03/2024  Medication Sig   amLODipine (NORVASC) 10 MG tablet TAKE 1 TABLET BY MOUTH EVERY DAY   hydrochlorothiazide (HYDRODIURIL) 25 MG tablet TAKE 1 TABLET (25 MG TOTAL) BY MOUTH DAILY.   tirzepatide (ZEPBOUND) 12.5 MG/0.5ML Pen Inject 12.5 mg into the skin once a week.   No facility-administered encounter medications on file as of 01/03/2024.    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    Right side 2015 Left side 2020   Family History  Problem Relation Age of Onset   Diabetes Mother    Heart disease Mother    Obesity Mother    Emphysema Father    Diabetes Sister    Breast cancer Neg Hx    Social History   Socioeconomic History   Marital status: Married    Spouse name: John   Number of children: 2   Years of  education: Not on file   Highest education level: Bachelor's degree (e.g., BA, AB, BS)  Occupational History    Employer: [employer]   Occupation: Retired    Start: 12/06/2023  Tobacco Use   Smoking status: Former    Current packs/day: 0.00    Average packs/day: 1 pack/day for 35.0 years (35.0 ttl pk-yrs)    Types: Cigarettes    Start date: 09/26/1971    Quit date: 2008    Years since quitting: 17.2   Smokeless tobacco: Never  Vaping Use   Vaping status: Never Used  Substance and Sexual Activity   Alcohol use: Yes    Alcohol/week: 2.0 standard drinks of alcohol    Types: 2 Glasses of wine per week   Drug use: Never   Sexual activity: Not Currently  Other Topics Concern   Not on file  Social History Narrative   Not on file   Social Drivers of Health   Financial Resource Strain: Low Risk  (10/04/2023)   Overall Financial Resource Strain (CARDIA)    Difficulty of Paying Living Expenses: Not hard at all  Food Insecurity: No Food Insecurity (10/04/2023)   Hunger Vital Sign    Worried About Running Out of Food in the Last Year: Never true    Ran Out of Food in the Last Year: Never true  Transportation Needs: No Transportation Needs (10/04/2023)  PRAPARE - Administrator, Civil Service (Medical): No    Lack of Transportation (Non-Medical): No  Physical Activity: Inactive (10/04/2023)   Exercise Vital Sign    Days of Exercise per Week: 1 day    Minutes of Exercise per Session: 0 min  Stress: No Stress Concern Present (10/04/2023)   [employer] of Occupational Health - Occupational Stress Questionnaire    Feeling of Stress : Not at all  Social Connections: Moderately Isolated (10/04/2023)   Social Connection and Isolation Panel [NHANES]    Frequency of Communication with Friends and Family: Once a week    Frequency of Social Gatherings with Friends and Family: More than three times a week    Attends Religious Services: Never    Database administrator or Organizations: No     Attends Engineer, structural: Not on file    Marital Status: Married    Tobacco Counseling Counseling given: Not Answered   Clinical Intake:  Pre-visit preparation completed: No  Pain : No/denies pain Pain Score: 0-No pain     BMI - recorded: 25.98 Nutritional Status: BMI 25 -29 Overweight Nutritional Risks: None Diabetes: No  How often do you need to have someone help you when you read instructions, pamphlets, or other written materials from your doctor or pharmacy?: 1 - Never  Interpreter Needed?: No      Activities of Daily Living    01/03/2024   10:58 AM 12/30/2023    8:42 AM  In your present state of health, do you have any difficulty performing the following activities:  Hearing? 0 0  Vision? 0 0  Difficulty concentrating or making decisions? 0 0  Walking or climbing stairs? 0 0  Dressing or bathing? 0 0  Doing errands, shopping? 0 0  Preparing Food and eating ? N N  Using the Toilet? N N  In the past six months, have you accidently leaked urine? N N  Do you have problems with loss of bowel control? N N  Managing your Medications? N N  Managing your Finances? N N  Housekeeping or managing your Housekeeping? N N    Patient Care Team: Larae Grooms, NP as PCP - General (Nurse Practitioner)  Indicate any recent Medical Services you may have received from other than [facility] providers in the past year (date may be approximate).     Assessment:   This is a routine wellness examination for Victoria Roach.  Hearing/Vision screen Vision Screening   Right eye Left eye Both eyes  Without correction 20/40 20/40 20/40   With correction        Goals Addressed               This Visit's Progress     DIET - INCREASE WATER INTAKE (pt-stated)        Increase My Muscle Strength (pt-stated)        Timeframe:  Long-Range Goal Priority:  Medium Start Date:  01/04/2024                             - start slowly and increase the amount of time every week     Why is this important?   Walking and easy exercises make you stronger. These also give you energy.  Every little bit helps, at any age.          Maintain or improve exercise capacity (6 minute walk)  Patient is not on track and improving. She would like to start hiking with friends her age.       Depression Screen    01/03/2024   11:19 AM 10/05/2023   10:11 AM 06/05/2023    2:50 PM 01/15/2023   10:54 AM 09/05/2022    9:58 AM 08/07/2022    8:05 AM 06/13/2022    9:31 AM  PHQ 2/9 Scores  PHQ - 2 Score 0 0 0 0 1 1 1   PHQ- 9 Score 5 2 4 2 6 5 5     Fall Risk    01/03/2024   11:01 AM 12/30/2023    8:42 AM 01/15/2023   10:54 AM 09/05/2022    9:58 AM 08/07/2022    8:05 AM  Fall Risk   Falls in the past year? 0 0 0 0 0  Number falls in past yr: 0 0 0 0 0  Injury with Fall? 0 0 0 0 0  Risk for fall due to : No Fall Risks  No Fall Risks No Fall Risks No Fall Risks  Follow up Falls evaluation completed  Falls evaluation completed Falls evaluation completed Falls evaluation completed    MEDICARE RISK AT HOME: Medicare Risk at Home Any stairs in or around the home?: Yes If so, are there any without handrails?: Yes Home free of loose throw rugs in walkways, pet beds, electrical cords, etc?: Yes Adequate lighting in your home to reduce risk of falls?: Yes Life alert?: No Use of a cane, walker or w/c?: No Grab bars in the bathroom?: No Shower chair or bench in shower?: Yes Elevated toilet seat or a handicapped toilet?: No  TIMED UP AND GO:  Was the test performed?  {AMBTIMEDUPGO:[phone]}    Cognitive Function:    01/03/2024   11:04 AM  MMSE - Mini Mental State Exam  Orientation to time 5  Orientation to Place 5  Registration 3  Attention/ Calculation 5  Recall 3  Language- name 2 objects 2  Language- repeat 1  Language- follow 3 step command 3  Language- read & follow direction 1  Write a sentence 1  Copy design 1  Total score 30        10/05/2023    10:30 AM 10/05/2023   10:29 AM  6CIT Screen  What Year? 0 points 0 points  What month?  0 points  What time?  0 points  Count back from 20  0 points  Months in reverse  0 points  Repeat phrase  0 points  Total Score  0 points    Immunizations Immunization History  Administered Date(s) Administered   Influenza,inj,Quad PF,6+ Mos 06/13/2022, 07/04/2023   Influenza,inj,Quad PF,6-35 Mos 07/14/2019, 08/30/2019   Moderna Sars-Covid-2 Vaccination 01/12/2020, 02/20/2020   PNEUMOCOCCAL CONJUGATE-20 08/07/2022   Tdap 10/21/2013, 09/28/2022   Zoster, Live 10/21/2013    {TDAP status:2101805}  {Flu Vaccine status:2101806}  {Pneumococcal vaccine status:2101807}  {Covid-19 vaccine status:2101808}  Qualifies for Shingles Vaccine? {YES/NO:21197}  Zostavax completed {YES/NO:21197}  {Shingrix Completed?:2101804}  Screening Tests Health Maintenance  Topic Date Due   Colonoscopy  Never done   Zoster Vaccines- Shingrix (1 of 2) 07/09/2007   DEXA SCAN  Never done   COVID-19 Vaccine (3 - 2024-25 season) 05/27/2023   INFLUENZA VACCINE  04/25/2024   Medicare Annual Wellness (AWV)  01/02/2025   MAMMOGRAM  10/21/2025   DTaP/Tdap/Td (3 - Td or Tdap) 09/28/2032   Pneumonia Vaccine 58+ Years old  Completed   Hepatitis C Screening  Completed   HPV VACCINES  Aged Out   Meningococcal B Vaccine  Aged Out    Health Maintenance  Health Maintenance Due  Topic Date Due   Colonoscopy  Never done   Zoster Vaccines- Shingrix (1 of 2) 07/09/2007   DEXA SCAN  Never done   COVID-19 Vaccine (3 - 2024-25 season) 05/27/2023    {Colorectal cancer screening:2101809}  {Mammogram status:21018020}  {Bone Density status:21018021}  Lung Cancer Screening: (Low Dose CT Chest recommended if Age 36-80 years, 20 pack-year currently smoking OR have quit w/in 15years.) {DOES NOT does:27190::"does not"} qualify.   Lung Cancer Screening Referral: ***  Additional Screening:  Hepatitis C Screening: {DOES NOT  does:27190::"does not"} qualify; Completed ***  Vision Screening: Recommended annual ophthalmology exams for early detection of glaucoma and other disorders of the eye. Is the patient up to date with their annual eye exam?  {YES/NO:21197} Who is the provider or what is the name of the office in which the patient attends annual eye exams? *** If pt is not established with a provider, would they like to be referred to a provider to establish care? {YES/NO:21197}.   Dental Screening: Recommended annual dental exams for proper oral hygiene  Diabetic Foot Exam: {Diabetic Foot Exam:2101802}  Community Resource Referral / Chronic Care Management: CRR required this visit?  {YES/NO:21197}  CCM required this visit?  {CCM Required [phone]}     Plan:     I have personally reviewed and noted the following in the patient's chart:   Medical and social history Use of alcohol, tobacco or illicit drugs  Current medications and supplements including opioid prescriptions. {Opioid [phone]} Functional ability and status Nutritional status Physical activity Advanced directives List of other physicians Hospitalizations, surgeries, and ER visits in previous 12 months Vitals Screenings to include cognitive, depression, and falls Referrals and appointments  In addition, I have reviewed and discussed with patient certain preventive protocols, quality metrics, and best practice recommendations. A written personalized care plan for preventive services as well as general preventive health recommendations were provided to patient.     Jasmine Pang Kailin Principato, CMA   01/03/2024   After Visit Summary: {CHL AMB AWV After Visit Summary:[phone]}  Nurse Notes: ***     Hearing Screen Results  20 dB HL   Right Ear Left Ear  500 Hz Pass [x]  Fail []  Pass [x]  Fail []   1,000 Hz Pass []  Fail [x]  Pass []  Fail [x]   2,000 Hz Pass [x]  Fail []  Pass [x]  Fail []   4,000 Hz Pass [x]  Fail  []  Pass [x]  Fail []     25 dB HL   Right Ear Left Ear  500 Hz Pass [x]  Fail []  Pass [x]  Fail []   1,000 Hz Pass []  Fail [x]  Pass [x]  Fail []   2,000 Hz Pass [x]  Fail []  Pass [x]  Fail []   4,000 Hz Pass [x]  Fail []  Pass [x]  Fail []     40 dB HL   Right Ear Left Ear  500 Hz Pass [x]  Fail []  Pass [x]  Fail []   1,000 Hz Pass [x]  Fail []  Pass []  Fail [x]   2,000 Hz Pass [x]  Fail []  Pass [x]  Fail []   4,000 Hz Pass [x]  Fail []  Pass [x]  Fail []    Comments:

## 2024-01-03 NOTE — Assessment & Plan Note (Signed)
A voluntary discussion about advance care planning including the explanation and discussion of advance directives was extensively discussed  with the patient for 5 minutes with patient.  Explanation about the health care proxy and Living will was reviewed and packet with forms with explanation of how to fill them out was given.  During this discussion, the patient was able to identify a health care proxy as her daughter and plans to fill out the paperwork required.  Patient was offered a separate Advance Care Planning visit for further assistance with forms.    

## 2024-01-03 NOTE — Progress Notes (Signed)
 Subjective:   Victoria Roach is a 68 y.o. female who presents for Medicare Annual (Subsequent) preventive examination.  Visit Complete: In person  Patient Medicare AWV questionnaire was completed by the patient on 01/03/24 ; I have confirmed that all information answered by patient is correct and no changes since this date.  Cardiac Risk Factors include: advanced age (>35men, >8 women)     Objective:    Today's Vitals   01/03/24 1057 01/03/24 1121  BP: (!) 172/80 112/71  Pulse: (!) 56   Resp: 16   Temp: 98.7 F (37.1 C)   TempSrc: Oral   SpO2: 98%   Weight: 152 lb 3.2 oz (69 kg)   Height: 5' 4.17" (1.63 m)   PainSc: 0-No pain    Body mass index is 25.98 kg/m.     01/03/2024   11:01 AM 03/11/2021    4:07 PM  Advanced Directives  Does Patient Have a Medical Advance Directive? No No  Would patient like information on creating a medical advance directive? Yes (MAU/Ambulatory/Procedural Areas - Information given)     Current Medications (verified) Outpatient Encounter Medications as of 01/03/2024  Medication Sig   [DISCONTINUED] amLODipine (NORVASC) 10 MG tablet TAKE 1 TABLET BY MOUTH EVERY DAY   [DISCONTINUED] hydrochlorothiazide (HYDRODIURIL) 25 MG tablet TAKE 1 TABLET (25 MG TOTAL) BY MOUTH DAILY.   [DISCONTINUED] tirzepatide (ZEPBOUND) 12.5 MG/0.5ML Pen Inject 12.5 mg into the skin once a week.   amLODipine (NORVASC) 10 MG tablet Take 1 tablet (10 mg total) by mouth daily.   hydrochlorothiazide (HYDRODIURIL) 25 MG tablet Take 1 tablet (25 mg total) by mouth daily.   tirzepatide (ZEPBOUND) 12.5 MG/0.5ML Pen Inject 12.5 mg into the skin once a week.   [DISCONTINUED] amLODipine (NORVASC) 10 MG tablet Take 1 tablet (10 mg total) by mouth daily.   [DISCONTINUED] hydrochlorothiazide (HYDRODIURIL) 25 MG tablet Take 1 tablet (25 mg total) by mouth daily.   No facility-administered encounter medications on file as of 01/03/2024.    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    Right side 2015 Left side 2020   Family History  Problem Relation Age of Onset   Diabetes Mother    Heart disease Mother    Obesity Mother    Emphysema Father    Diabetes Sister    Breast cancer Neg Hx    Social History   Socioeconomic History   Marital status: Married    Spouse name: John   Number of children: 2   Years of education: Not on file   Highest education level: Bachelor's degree (e.g., BA, AB, BS)  Occupational History    Employer: [employer]   Occupation: Retired    Start: 12/06/2023  Tobacco Use   Smoking status: Former    Current packs/day: 0.00    Average packs/day: 1 pack/day for 35.0 years (35.0 ttl pk-yrs)    Types: Cigarettes    Start date: 09/26/1971    Quit date: 2008    Years since quitting: 17.2   Smokeless tobacco: Never  Vaping Use   Vaping status: Never Used  Substance and Sexual Activity   Alcohol use: Yes    Alcohol/week: 2.0 standard drinks of alcohol    Types: 2 Glasses of wine per week   Drug use: Never   Sexual activity: Not Currently  Other  Topics Concern   Not on file  Social History Narrative   Not on file   Social Drivers of Health   Financial Resource Strain: Low Risk  (10/04/2023)   Overall Financial Resource Strain (CARDIA)    Difficulty of Paying Living Expenses: Not hard at all  Food Insecurity: No Food Insecurity (10/04/2023)   Hunger Vital Sign    Worried About Running Out of Food in the Last Year: Never true    Ran Out of Food in the Last Year: Never true  Transportation Needs: No Transportation Needs (10/04/2023)   PRAPARE - Administrator, Civil Service (Medical): No    Lack of Transportation (Non-Medical): No  Physical Activity: Inactive (10/04/2023)   Exercise Vital Sign    Days of Exercise per Week: 1 day    Minutes of  Exercise per Session: 0 min  Stress: No Stress Concern Present (10/04/2023)   [employer] of Occupational Health - Occupational Stress Questionnaire    Feeling of Stress : Not at all  Social Connections: Moderately Isolated (10/04/2023)   Social Connection and Isolation Panel [NHANES]    Frequency of Communication with Friends and Family: Once a week    Frequency of Social Gatherings with Friends and Family: More than three times a week    Attends Religious Services: Never    Database administrator or Organizations: No    Attends Engineer, structural: Not on file    Marital Status: Married    Tobacco Counseling Counseling given: Not Answered   Clinical Intake:  Pre-visit preparation completed: No  Pain : No/denies pain Pain Score: 0-No pain     BMI - recorded: 25.98 Nutritional Status: BMI 25 -29 Overweight Nutritional Risks: None Diabetes: No  How often do you need to have someone help you when you read instructions, pamphlets, or other written materials from your doctor or pharmacy?: 1 - Never  Interpreter Needed?: No      Activities of Daily Living    01/03/2024   10:58 AM 12/30/2023    8:42 AM  In your present state of health, do you have any difficulty performing the following activities:  Hearing? 0 0  Vision? 0 0  Difficulty concentrating or making decisions? 0 0  Walking or climbing stairs? 0 0  Dressing or bathing? 0 0  Doing errands, shopping? 0 0  Preparing Food and eating ? N N  Using the Toilet? N N  In the past six months, have you accidently leaked urine? N N  Do you have problems with loss of bowel control? N N  Managing your Medications? N N  Managing your Finances? N N  Housekeeping or managing your Housekeeping? N N    Patient Care Team: Larae Grooms, NP as PCP - General (Nurse Practitioner)  Indicate any recent Medical Services you may have received from other than [facility] providers in the past year (date may be  approximate).     Assessment:   This is a routine wellness examination for [facility].  Hearing/Vision screen Vision Screening   Right eye Left eye Both eyes  Without correction 20/40 20/40 20/40   With correction        Goals Addressed               This Visit's Progress     DIET - INCREASE WATER INTAKE (pt-stated)        Increase My Muscle Strength (pt-stated)        Timeframe:  [employer]  Goal Priority:  Medium Start Date:  01/04/2024                             - start slowly and increase the amount of time every week    Why is this important?   Walking and easy exercises make you stronger. These also give you energy.  Every little bit helps, at any age.          Maintain or improve exercise capacity (6 minute walk)        Patient is not on track and improving. She would like to start hiking with friends her age.        Depression Screen    01/03/2024   11:19 AM 10/05/2023   10:11 AM 06/05/2023    2:50 PM 01/15/2023   10:54 AM 09/05/2022    9:58 AM 08/07/2022    8:05 AM 06/13/2022    9:31 AM  PHQ 2/9 Scores  PHQ - 2 Score 0 0 0 0 1 1 1   PHQ- 9 Score 5 2 4 2 6 5 5     Fall Risk    01/03/2024   11:01 AM 12/30/2023    8:42 AM 01/15/2023   10:54 AM 09/05/2022    9:58 AM 08/07/2022    8:05 AM  Fall Risk   Falls in the past year? 0 0 0 0 0  Number falls in past yr: 0 0 0 0 0  Injury with Fall? 0 0 0 0 0  Risk for fall due to : No Fall Risks  No Fall Risks No Fall Risks No Fall Risks  Follow up Falls evaluation completed  Falls evaluation completed Falls evaluation completed Falls evaluation completed    EKG:  normal EKG, normal sinus rhythm  MEDICARE RISK AT HOME: Medicare Risk at Home Any stairs in or around the home?: Yes If so, are there any without handrails?: Yes Home free of loose throw rugs in walkways, pet beds, electrical cords, etc?: Yes Adequate lighting in your home to reduce risk of falls?: Yes Life alert?: No Use of a cane, walker or  w/c?: No Grab bars in the bathroom?: No Shower chair or bench in shower?: Yes Elevated toilet seat or a handicapped toilet?: No  TIMED UP AND GO:  Was the test performed?  Yes  Length of time to ambulate 10 feet: 8 sec Gait steady and fast without use of assistive device    Cognitive Function:    01/03/2024   11:04 AM  MMSE - Mini Mental State Exam  Orientation to time 5  Orientation to Place 5  Registration 3  Attention/ Calculation 5  Recall 3  Language- name 2 objects 2  Language- repeat 1  Language- follow 3 step command 3  Language- read & follow direction 1  Write a sentence 1  Copy design 1  Total score 30        10/05/2023   10:30 AM 10/05/2023   10:29 AM  6CIT Screen  What Year? 0 points 0 points  What month?  0 points  What time?  0 points  Count back from 20  0 points  Months in reverse  0 points  Repeat phrase  0 points  Total Score  0 points    Immunizations Immunization History  Administered Date(s) Administered   Influenza,inj,Quad PF,6+ Mos 06/13/2022, 07/04/2023   Influenza,inj,Quad PF,6-35 Mos 07/14/2019, 08/30/2019   Moderna Sars-Covid-2 Vaccination 01/12/2020, 02/20/2020  PNEUMOCOCCAL CONJUGATE-20 08/07/2022   Tdap 10/21/2013, 09/28/2022   Zoster, Live 10/21/2013    TDAP status: Up to date  Flu Vaccine status: Up to date  Pneumococcal vaccine status: Declined,  Education has been provided regarding the importance of this vaccine but patient still declined. Advised may receive this vaccine at local pharmacy or Health Dept. Aware to provide a copy of the vaccination record if obtained from local pharmacy or Health Dept. Verbalized acceptance and understanding.   Covid-19 vaccine status: Declined, Education has been provided regarding the importance of this vaccine but patient still declined. Advised may receive this vaccine at local pharmacy or Health Dept.or vaccine clinic. Aware to provide a copy of the vaccination record if obtained from  local pharmacy or Health Dept. Verbalized acceptance and understanding.  Qualifies for Shingles Vaccine? Yes   Zostavax completed No   Shingrix Completed?: No.    Education has been provided regarding the importance of this vaccine. Patient has been advised to call insurance company to determine out of pocket expense if they have not yet received this vaccine. Advised may also receive vaccine at local pharmacy or Health Dept. Verbalized acceptance and understanding.  Screening Tests Health Maintenance  Topic Date Due   COVID-19 Vaccine (3 - 2024-25 season) 01/19/2024 (Originally 05/27/2023)   Zoster Vaccines- Shingrix (1 of 2) 04/03/2024 (Originally 07/09/2007)   Colonoscopy  01/02/2025 (Originally 07/08/2002)   INFLUENZA VACCINE  04/25/2024   Medicare Annual Wellness (AWV)  01/02/2025   MAMMOGRAM  10/21/2025   DTaP/Tdap/Td (3 - Td or Tdap) 09/28/2032   Pneumonia Vaccine 96+ Years old  Completed   DEXA SCAN  Completed   Hepatitis C Screening  Completed   HPV VACCINES  Aged Out   Meningococcal B Vaccine  Aged Out    Health Maintenance  There are no preventive care reminders to display for this patient.  Colorectal cancer screening: Declines at this time.  Mammogram status: Completed 10/22/2023. Repeat every year.  Bone Density status: Declined at this time.  Lung Cancer Screening: (Low Dose CT Chest recommended if Age 36-80 years, 20 pack-year currently smoking OR have quit w/in 15years.) does qualify.   Lung Cancer Screening Referral: referral placed.  Additional Screening:  Hepatitis C Screening: does qualify; Completed 08/07/2022  Vision Screening: Recommended annual ophthalmology exams for early detection of glaucoma and other disorders of the eye. Is the patient up to date with their annual eye exam?  No   Dental Screening: Recommended annual dental exams for proper oral hygiene   Community Resource Referral / Chronic Care Management: CRR required this visit?  No    CCM required this visit?  No     Plan:     I have personally reviewed and noted the following in the patient's chart:   Medical and social history Use of alcohol, tobacco or illicit drugs  Current medications and supplements including opioid prescriptions. Patient is not currently taking opioid prescriptions. Functional ability and status Nutritional status Physical activity Advanced directives List of other physicians Hospitalizations, surgeries, and ER visits in previous 12 months Vitals Screenings to include cognitive, depression, and falls Referrals and appointments  In addition, I have reviewed and discussed with patient certain preventive protocols, quality metrics, and best practice recommendations. A written personalized care plan for preventive services as well as general preventive health recommendations were provided to patient.     Jasmine Pang Drayce Tawil, CMA   01/03/2024   After Visit Summary: (In Person-Printed) AVS printed and given to the  patient

## 2024-01-03 NOTE — Progress Notes (Signed)
 BP 112/71 Comment: home blood pressure reading  Pulse (!) 56   Temp 98.7 F (37.1 C) (Oral)   Resp 16   Ht 5' 4.17" (1.63 m)   Wt 152 lb 3.2 oz (69 kg)   SpO2 98%   BMI 25.98 kg/m    Subjective:    Patient ID: Victoria Roach, female    DOB: Oct 25, 1955, 68 y.o.   MRN: [id number]  HPI: Victoria Roach is a 68 y.o. female  Chief Complaint  Patient presents with   Medicare Wellness    Welcome to Medicare   HYPERTENSION without Chronic Kidney Disease Hypertension status: controlled  Satisfied with current treatment? yes Duration of hypertension: years BP monitoring frequency:  daily BP range: 108-112/70 BP medication side effects:  no Medication compliance: excellent compliance Previous BP meds:amlodipine and HCTZ Aspirin: no Recurrent headaches: no Visual changes: no Palpitations: no Dyspnea: no Chest pain: no Lower extremity edema: no Dizzy/lightheaded: no   WEIGHT MANAGEMENT Doing well with Zepbound.  Would like to get to 145lbs.  Would like to stay on the 12.5mg  dose.    Relevant past medical, surgical, family and social history reviewed and updated as indicated. Interim medical history since our last visit reviewed. Allergies and medications reviewed and updated.  Review of Systems  Eyes:  Negative for visual disturbance.  Respiratory:  Negative for cough, chest tightness and shortness of breath.   Cardiovascular:  Negative for chest pain, palpitations and leg swelling.  Neurological:  Negative for dizziness and headaches.    Per HPI unless specifically indicated above     Objective:    BP 112/71 Comment: home blood pressure reading  Pulse (!) 56   Temp 98.7 F (37.1 C) (Oral)   Resp 16   Ht 5' 4.17" (1.63 m)   Wt 152 lb 3.2 oz (69 kg)   SpO2 98%   BMI 25.98 kg/m   Wt Readings from Last 3 Encounters:  01/03/24 152 lb 3.2 oz (69 kg)  10/05/23 150 lb 6.4 oz (68.2 kg)  06/05/23 155 lb 9.6 oz (70.6 kg)    Physical Exam Vitals and  nursing note reviewed.  Constitutional:      General: She is not in acute distress.    Appearance: Normal appearance. She is normal weight. She is not ill-appearing, toxic-appearing or diaphoretic.  HENT:     Head: Normocephalic.     Right Ear: External ear normal.     Left Ear: External ear normal.     Nose: Nose normal.     Mouth/Throat:     Mouth: Mucous membranes are moist.     Pharynx: Oropharynx is clear.  Eyes:     General:        Right eye: No discharge.        Left eye: No discharge.     Extraocular Movements: Extraocular movements intact.     Conjunctiva/sclera: Conjunctivae normal.     Pupils: Pupils are equal, round, and reactive to light.  Cardiovascular:     Rate and Rhythm: Normal rate and regular rhythm.     Heart sounds: No murmur heard. Pulmonary:     Effort: Pulmonary effort is normal. No respiratory distress.     Breath sounds: Normal breath sounds. No wheezing or rales.  Musculoskeletal:     Cervical back: Normal range of motion and neck supple.  Skin:    General: Skin is warm and dry.     Capillary Refill: Capillary refill takes less than 2 seconds.  Neurological:     General: No focal deficit present.     Mental Status: She is alert and oriented to person, place, and time. Mental status is at baseline.  Psychiatric:        Mood and Affect: Mood normal.        Behavior: Behavior normal.        Thought Content: Thought content normal.        Judgment: Judgment normal.     Results for orders placed or performed in visit on 10/05/23  Comprehensive metabolic panel   Collection Time: 10/05/23 10:39 AM  Result Value Ref Range   Glucose 92 70 - 99 mg/dL   BUN 8 8 - 27 mg/dL   Creatinine, Ser 1.61 0.57 - 1.00 mg/dL   eGFR 95 >09 UE/AVW/0.98   BUN/Creatinine Ratio 11 (L) 12 - 28   Sodium 141 134 - 144 mmol/L   Potassium 3.7 3.5 - 5.2 mmol/L   Chloride 99 96 - 106 mmol/L   CO2 26 20 - 29 mmol/L   Calcium 9.6 8.7 - 10.3 mg/dL   Total Protein 7.1 6.0 -  8.5 g/dL   Albumin 4.4 3.9 - 4.9 g/dL   Globulin, Total 2.7 1.5 - 4.5 g/dL   Bilirubin Total 0.6 0.0 - 1.2 mg/dL   Alkaline Phosphatase 81 44 - 121 IU/L   AST 19 0 - 40 IU/L   ALT 22 0 - 32 IU/L  Lipid panel   Collection Time: 10/05/23 10:39 AM  Result Value Ref Range   Cholesterol, Total 241 (H) 100 - 199 mg/dL   Triglycerides 119 0 - 149 mg/dL   HDL 81 >14 mg/dL   VLDL Cholesterol Cal 25 5 - 40 mg/dL   LDL Chol Calc (NIH) 782 (H) 0 - 99 mg/dL   Chol/HDL Ratio 3.0 0.0 - 4.4 ratio  HgB A1c   Collection Time: 10/05/23 10:39 AM  Result Value Ref Range   Hgb A1c MFr Bld 5.4 4.8 - 5.6 %   Est. average glucose Bld gHb Est-mCnc 108 mg/dL      Assessment & Plan:   Problem List Items Addressed This Visit       Cardiovascular and Mediastinum   Hypertension   Chronic.  Controlled.  Continue with current medication regimen of Amlodipine and HCTZ.  Refills sent today.  Return to clinic in 6 months for reevaluation.  Call sooner if concerns arise.        Relevant Medications   amLODipine (NORVASC) 10 MG tablet   hydrochlorothiazide (HYDRODIURIL) 25 MG tablet     Other   Advanced care planning/counseling discussion   A voluntary discussion about advance care planning including the explanation and discussion of advance directives was extensively discussed  with the patient for 5 minutes with patient.  Explanation about the health care proxy and Living will was reviewed and packet with forms with explanation of how to fill them out was given.  During this discussion, the patient was able to identify a health care proxy as her daughter and plans to fill out the paperwork required.  Patient was offered a separate Advance Care Planning visit for further assistance with forms.         Other Visit Diagnoses       Welcome to Medicare preventive visit    -  Primary   Relevant Orders   EKG 12-Lead     Encounter for Medicare annual wellness exam         Post-menopausal  Relevant  Orders   HM DEXA SCAN (Completed)        Follow up plan: Return in 1 year (on 01/02/2025).

## 2024-01-03 NOTE — Progress Notes (Deleted)
 Subjective:    Victoria Roach is a 68 y.o. female who presents for a Welcome to Medicare exam.   Cardiac Risk Factors include: advanced age (>34men, >77 women)      Objective:    Today's Vitals   01/03/24 1057 01/03/24 1121  BP: (!) 172/80 112/71  Pulse: (!) 56   Resp: 16   Temp: 98.7 F (37.1 C)   TempSrc: Oral   SpO2: 98%   Weight: 152 lb 3.2 oz (69 kg)   Height: 5' 4.17" (1.63 m)   PainSc: 0-No pain   Body mass index is 25.98 kg/m.  Medications Outpatient Encounter Medications as of 01/03/2024  Medication Sig   [DISCONTINUED] amLODipine (NORVASC) 10 MG tablet TAKE 1 TABLET BY MOUTH EVERY DAY   [DISCONTINUED] hydrochlorothiazide (HYDRODIURIL) 25 MG tablet TAKE 1 TABLET (25 MG TOTAL) BY MOUTH DAILY.   [DISCONTINUED] tirzepatide (ZEPBOUND) 12.5 MG/0.5ML Pen Inject 12.5 mg into the skin once a week.   amLODipine (NORVASC) 10 MG tablet Take 1 tablet (10 mg total) by mouth daily.   hydrochlorothiazide (HYDRODIURIL) 25 MG tablet Take 1 tablet (25 mg total) by mouth daily.   tirzepatide (ZEPBOUND) 12.5 MG/0.5ML Pen Inject 12.5 mg into the skin once a week.   [DISCONTINUED] amLODipine (NORVASC) 10 MG tablet Take 1 tablet (10 mg total) by mouth daily.   [DISCONTINUED] hydrochlorothiazide (HYDRODIURIL) 25 MG tablet Take 1 tablet (25 mg total) by mouth daily.   No facility-administered encounter medications on file as of 01/03/2024.     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    Right side 2015 Left side 2020    Family History  Problem Relation Age of Onset   Diabetes Mother    Heart disease Mother    Obesity Mother    Emphysema Father    Diabetes Sister    Breast cancer Neg Hx    Social History   Occupational History    Employer: [employer]   Occupation: Retired    Start: 12/06/2023  Tobacco Use   Smoking status: Former     Current packs/day: 0.00    Average packs/day: 1 pack/day for 35.0 years (35.0 ttl pk-yrs)    Types: Cigarettes    Start date: 09/26/1971    Quit date: 2008    Years since quitting: 17.2   Smokeless tobacco: Never  Vaping Use   Vaping status: Never Used  Substance and Sexual Activity   Alcohol use: Yes    Alcohol/week: 2.0 standard drinks of alcohol    Types: 2 Glasses of wine per week   Drug use: Never   Sexual activity: Not Currently    Tobacco Counseling Counseling given: Not Answered   Immunizations and Health Maintenance Immunization History  Administered Date(s) Administered   Influenza,inj,Quad PF,6+ Mos 06/13/2022, 07/04/2023   Influenza,inj,Quad PF,6-35 Mos 07/14/2019, 08/30/2019   Moderna Sars-Covid-2 Vaccination 01/12/2020, 02/20/2020   PNEUMOCOCCAL CONJUGATE-20 08/07/2022   Tdap 10/21/2013, 09/28/2022   Zoster, Live 10/21/2013   There are no preventive care reminders to display for this patient.  Activities of Daily Living    01/03/2024   10:58 AM 12/30/2023    8:42 AM  In your present state of health, do you have any difficulty performing the following activities:  Hearing? 0 0  Vision? 0 0  Difficulty concentrating or making decisions? 0 0  Walking or climbing stairs? 0  0  Dressing or bathing? 0 0  Doing errands, shopping? 0 0  Preparing Food and eating ? N N  Using the Toilet? N N  In the past six months, have you accidently leaked urine? N N  Do you have problems with loss of bowel control? N N  Managing your Medications? N N  Managing your Finances? N N  Housekeeping or managing your Housekeeping? N N    Physical Exam   Physical Exam (optional), or other factors deemed appropriate based on the beneficiary's medical and social history and current clinical standards.   Advanced Directives: Does Patient Have a Medical Advance Directive?: No Would patient like information on creating a medical advance directive?: Yes (MAU/Ambulatory/Procedural Areas  - Information given)  EKG:  {ekg findings:315101}      Assessment:    This is a routine wellness examination for this patient . ***  Vision/Hearing screen Vision Screening   Right eye Left eye Both eyes  Without correction 20/40 20/40 20/40   With correction        Goals       DIET - INCREASE WATER INTAKE (pt-stated)      Increase My Muscle Strength (pt-stated)      Timeframe:  Long-Range Goal Priority:  Medium Start Date:  01/04/2024                             - start slowly and increase the amount of time every week    Why is this important?   Walking and easy exercises make you stronger. These also give you energy.  Every little bit helps, at any age.          Maintain or improve exercise capacity (6 minute walk)      Patient is not on track and improving. She would like to start hiking with friends her age.         Depression Screen    01/03/2024   11:19 AM 10/05/2023   10:11 AM 06/05/2023    2:50 PM 01/15/2023   10:54 AM  PHQ 2/9 Scores  PHQ - 2 Score 0 0 0 0  PHQ- 9 Score 5 2 4 2      Fall Risk    01/03/2024   11:01 AM  Fall Risk   Falls in the past year? 0  Number falls in past yr: 0  Injury with Fall? 0  Risk for fall due to : No Fall Risks  Follow up Falls evaluation completed    Cognitive Function:    01/03/2024   11:04 AM  MMSE - Mini Mental State Exam  Orientation to time 5  Orientation to Place 5  Registration 3  Attention/ Calculation 5  Recall 3  Language- name 2 objects 2  Language- repeat 1  Language- follow 3 step command 3  Language- read & follow direction 1  Write a sentence 1  Copy design 1  Total score 30        10/05/2023   10:30 AM 10/05/2023   10:29 AM  6CIT Screen  What Year? 0 points 0 points  What month?  0 points  What time?  0 points  Count back from 20  0 points  Months in reverse  0 points  Repeat phrase  0 points  Total Score  0 points    Patient Care Team: Larae Grooms, NP as PCP -  General (Nurse Practitioner)     Plan:   ***  I have personally reviewed and noted the following in the patient's chart:   Medical and social history Use of alcohol, tobacco or illicit drugs  Current medications and supplements including opioid prescriptions. Patient is not currently taking opioid prescriptions. Functional ability and status Nutritional status Physical activity Advanced directives List of other physicians Hospitalizations, surgeries, and ER visits in previous 12 months Vitals Screenings to include cognitive, depression, and falls Referrals and appointments  In addition, I have reviewed and discussed with patient certain preventive protocols, quality metrics, and best practice recommendations. A written personalized care plan for preventive services as well as general preventive health recommendations were provided to patient.     Jasmine Pang Laini Urick, [place] 01/03/2024

## 2024-01-03 NOTE — Assessment & Plan Note (Signed)
 Chronic.  Controlled.  Continue with current medication regimen of Amlodipine and HCTZ.  Refills sent today.  Return to clinic in 6 months for reevaluation.  Call sooner if concerns arise.

## 2024-01-03 NOTE — Patient Instructions (Signed)
 Please call to schedule your mammogram and/or bone density: [facility]  Address: [address], [place], [place] [address] Phone: [phone]  [facility] [address] [place],  [place]  [address] Phone: [phone]

## 2024-01-17 ENCOUNTER — Other Ambulatory Visit: Payer: Self-pay

## 2024-04-04 ENCOUNTER — Ambulatory Visit (INDEPENDENT_AMBULATORY_CARE_PROVIDER_SITE_OTHER): Admitting: Nurse Practitioner

## 2024-04-04 ENCOUNTER — Encounter: Payer: Self-pay | Admitting: Nurse Practitioner

## 2024-04-04 ENCOUNTER — Other Ambulatory Visit: Payer: Self-pay

## 2024-04-04 VITALS — BP 127/82 | HR 69 | Temp 98.3°F | Ht 64.0 in | Wt 148.8 lb

## 2024-04-04 DIAGNOSIS — Z6825 Body mass index (BMI) 25.0-25.9, adult: Secondary | ICD-10-CM | POA: Insufficient documentation

## 2024-04-04 DIAGNOSIS — I10 Essential (primary) hypertension: Secondary | ICD-10-CM

## 2024-04-04 DIAGNOSIS — E782 Mixed hyperlipidemia: Secondary | ICD-10-CM

## 2024-04-04 DIAGNOSIS — R7303 Prediabetes: Secondary | ICD-10-CM

## 2024-04-04 MED ORDER — TIRZEPATIDE-WEIGHT MANAGEMENT 15 MG/0.5ML ~~LOC~~ SOAJ
15.0000 mg | SUBCUTANEOUS | 2 refills | Status: DC
  Filled 2024-04-04 (×2): qty 2, 28d supply, fill #0

## 2024-04-04 NOTE — Progress Notes (Signed)
 BP 127/82   Pulse 69   Temp 98.3 F (36.8 C) (Oral)   Ht 5' 4 (1.626 m)   Wt 148 lb 12.8 oz (67.5 kg)   SpO2 99%   BMI 25.54 kg/m    Subjective:    Patient ID: Victoria Roach, female    DOB: 24-Aug-1956, 68 y.o.   MRN: [id number]  HPI: Victoria Roach is a 68 y.o. female  Chief Complaint  Patient presents with   Hypertension   HYPERTENSION / HYPERLIPIDEMIA Satisfied with current treatment? yes Duration of hypertension: years BP monitoring frequency: daily BP range: 120/70-80 BP medication side effects: no Past BP meds: amlodipine  and HCTZ Duration of hyperlipidemia: years Cholesterol medication side effects: no Cholesterol supplements: none Past cholesterol medications: none Medication compliance: excellent compliance Aspirin: no Recent stressors: no Recurrent headaches: no Visual changes: no Palpitations: no Dyspnea: no Chest pain: no Lower extremity edema: no Dizzy/lightheaded: yes  Doing well with Zepbound .  Would like to increase to 15mg  dose.     Relevant past medical, surgical, family and social history reviewed and updated as indicated. Interim medical history since our last visit reviewed. Allergies and medications reviewed and updated.  Review of Systems  Eyes:  Negative for visual disturbance.  Respiratory:  Negative for cough, chest tightness and shortness of breath.   Cardiovascular:  Negative for chest pain, palpitations and leg swelling.  Neurological:  Negative for dizziness and headaches.    Per HPI unless specifically indicated above     Objective:    BP 127/82   Pulse 69   Temp 98.3 F (36.8 C) (Oral)   Ht 5' 4 (1.626 m)   Wt 148 lb 12.8 oz (67.5 kg)   SpO2 99%   BMI 25.54 kg/m   Wt Readings from Last 3 Encounters:  04/04/24 148 lb 12.8 oz (67.5 kg)  01/03/24 152 lb 3.2 oz (69 kg)  10/05/23 150 lb 6.4 oz (68.2 kg)    Physical Exam Vitals and nursing note reviewed.  Constitutional:      General: She is not in  acute distress.    Appearance: Normal appearance. She is normal weight. She is not ill-appearing, toxic-appearing or diaphoretic.  HENT:     Head: Normocephalic.     Right Ear: External ear normal.     Left Ear: External ear normal.     Nose: Nose normal.     Mouth/Throat:     Mouth: Mucous membranes are moist.     Pharynx: Oropharynx is clear.  Eyes:     General:        Right eye: No discharge.        Left eye: No discharge.     Extraocular Movements: Extraocular movements intact.     Conjunctiva/sclera: Conjunctivae normal.     Pupils: Pupils are equal, round, and reactive to light.  Cardiovascular:     Rate and Rhythm: Normal rate and regular rhythm.     Heart sounds: No murmur heard. Pulmonary:     Effort: Pulmonary effort is normal. No respiratory distress.     Breath sounds: Normal breath sounds. No wheezing or rales.  Musculoskeletal:     Cervical back: Normal range of motion and neck supple.  Skin:    General: Skin is warm and dry.     Capillary Refill: Capillary refill takes less than 2 seconds.  Neurological:     General: No focal deficit present.     Mental Status: She is alert and oriented to person,  place, and time. Mental status is at baseline.  Psychiatric:        Mood and Affect: Mood normal.        Behavior: Behavior normal.        Thought Content: Thought content normal.        Judgment: Judgment normal.     Results for orders placed or performed in visit on 10/05/23  Comprehensive metabolic panel   Collection Time: 10/05/23 10:39 AM  Result Value Ref Range   Glucose 92 70 - 99 mg/dL   BUN 8 8 - 27 mg/dL   Creatinine, Ser 9.29 0.57 - 1.00 mg/dL   eGFR 95 >40 fO/fpw/8.26   BUN/Creatinine Ratio 11 (L) 12 - 28   Sodium 141 134 - 144 mmol/L   Potassium 3.7 3.5 - 5.2 mmol/L   Chloride 99 96 - 106 mmol/L   CO2 26 20 - 29 mmol/L   Calcium  9.6 8.7 - 10.3 mg/dL   Total Protein 7.1 6.0 - 8.5 g/dL   Albumin 4.4 3.9 - 4.9 g/dL   Globulin, Total 2.7 1.5 -  4.5 g/dL   Bilirubin Total 0.6 0.0 - 1.2 mg/dL   Alkaline Phosphatase 81 44 - 121 IU/L   AST 19 0 - 40 IU/L   ALT 22 0 - 32 IU/L  Lipid panel   Collection Time: 10/05/23 10:39 AM  Result Value Ref Range   Cholesterol, Total 241 (H) 100 - 199 mg/dL   Triglycerides 856 0 - 149 mg/dL   HDL 81 >60 mg/dL   VLDL Cholesterol Cal 25 5 - 40 mg/dL   LDL Chol Calc (NIH) 864 (H) 0 - 99 mg/dL   Chol/HDL Ratio 3.0 0.0 - 4.4 ratio  HgB A1c   Collection Time: 10/05/23 10:39 AM  Result Value Ref Range   Hgb A1c MFr Bld 5.4 4.8 - 5.6 %   Est. average glucose Bld gHb Est-mCnc 108 mg/dL      Assessment & Plan:   Problem List Items Addressed This Visit       Cardiovascular and Mediastinum   Hypertension   Chronic.  Controlled.  Continue with current medication regimen of Amlodipine  and HCTZ.  Continue to check blood pressures at home.  Refills sent today.  Return to clinic in 6 months for reevaluation.  Call sooner if concerns arise.       Relevant Orders   Comprehensive metabolic panel with GFR     Other   Hyperlipidemia   Chronic.  Controlled.  Continue with current medication regimen.  Labs ordered today.  Return to clinic in 6 months for reevaluation.  Call sooner if concerns arise.       Relevant Orders   Lipid panel   Prediabetes - Primary   Labs ordered at visit today.  Will make recommendations based on lab results.        Relevant Orders   Hemoglobin A1c   BMI 25.0-25.9,adult   Has been on Zepbound  12.5mg .  Would like to increase to 15mg .          Follow up plan: Return in about 6 months (around 10/05/2024) for Physical and Fasting labs.

## 2024-04-04 NOTE — Assessment & Plan Note (Signed)
 Chronic.  Controlled.  Continue with current medication regimen of Amlodipine  and HCTZ.  Continue to check blood pressures at home.  Refills sent today.  Return to clinic in 6 months for reevaluation.  Call sooner if concerns arise.

## 2024-04-04 NOTE — Assessment & Plan Note (Signed)
 Chronic.  Controlled.  Continue with current medication regimen.  Labs ordered today.  Return to clinic in 6 months for reevaluation.  Call sooner if concerns arise.  ? ?

## 2024-04-04 NOTE — Assessment & Plan Note (Signed)
 Has been on Zepbound  12.5mg .  Would like to increase to 15mg .

## 2024-04-04 NOTE — Assessment & Plan Note (Signed)
 Labs ordered at visit today.  Will make recommendations based on lab results.

## 2024-04-05 LAB — COMPREHENSIVE METABOLIC PANEL WITH GFR
ALT: 45 IU/L — ABNORMAL HIGH (ref 0–32)
AST: 26 IU/L (ref 0–40)
Albumin: 4.8 g/dL (ref 3.9–4.9)
Alkaline Phosphatase: 88 IU/L (ref 44–121)
BUN/Creatinine Ratio: 30 — ABNORMAL HIGH (ref 12–28)
BUN: 27 mg/dL (ref 8–27)
Bilirubin Total: 0.7 mg/dL (ref 0.0–1.2)
CO2: 20 mmol/L (ref 20–29)
Calcium: 10.3 mg/dL (ref 8.7–10.3)
Chloride: 99 mmol/L (ref 96–106)
Creatinine, Ser: 0.91 mg/dL (ref 0.57–1.00)
Globulin, Total: 2.9 g/dL (ref 1.5–4.5)
Glucose: 91 mg/dL (ref 70–99)
Potassium: 4.1 mmol/L (ref 3.5–5.2)
Sodium: 138 mmol/L (ref 134–144)
Total Protein: 7.7 g/dL (ref 6.0–8.5)
eGFR: 70 mL/min/1.73 (ref >59)

## 2024-04-05 LAB — LIPID PANEL
Chol/HDL Ratio: 2.7 ratio (ref 0.0–4.4)
Cholesterol, Total: 304 mg/dL — ABNORMAL HIGH (ref 100–199)
HDL: 113 mg/dL (ref >39)
LDL Chol Calc (NIH): 180 mg/dL — ABNORMAL HIGH (ref 0–99)
Triglycerides: 72 mg/dL (ref 0–149)
VLDL Cholesterol Cal: 11 mg/dL (ref 5–40)

## 2024-04-05 LAB — HEMOGLOBIN A1C
Est. average glucose Bld gHb Est-mCnc: 108 mg/dL
Hgb A1c MFr Bld: 5.4 % (ref 4.8–5.6)

## 2024-04-07 ENCOUNTER — Ambulatory Visit: Payer: Self-pay | Admitting: Nurse Practitioner

## 2024-06-28 ENCOUNTER — Encounter: Payer: Self-pay | Admitting: Nurse Practitioner

## 2024-07-07 MED ORDER — TIRZEPATIDE-WEIGHT MANAGEMENT 15 MG/0.5ML ~~LOC~~ SOAJ: 15.0000 mg | SUBCUTANEOUS | 2 refills | Status: AC

## 2024-07-31 DIAGNOSIS — Z0100 Encounter for examination of eyes and vision without abnormal findings: Secondary | ICD-10-CM | POA: Diagnosis not present

## 2024-07-31 DIAGNOSIS — H524 Presbyopia: Secondary | ICD-10-CM | POA: Diagnosis not present

## 2024-10-07 ENCOUNTER — Encounter: Admitting: Nurse Practitioner

## 2024-11-11 ENCOUNTER — Encounter: Admitting: Nurse Practitioner
